# Patient Record
Sex: Female | Born: 1965
Health system: Southern US, Community
[De-identification: ages and names within clinical notes are randomized; demographics above are authoritative.]

## PROBLEM LIST (undated history)

## (undated) DIAGNOSIS — R5381 Other malaise: Secondary | ICD-10-CM

## (undated) DIAGNOSIS — M545 Low back pain, unspecified: Secondary | ICD-10-CM

## (undated) DIAGNOSIS — E079 Disorder of thyroid, unspecified: Secondary | ICD-10-CM

## (undated) DIAGNOSIS — F32A Depression, unspecified: Secondary | ICD-10-CM

## (undated) DIAGNOSIS — F419 Anxiety disorder, unspecified: Secondary | ICD-10-CM

## (undated) DIAGNOSIS — F329 Major depressive disorder, single episode, unspecified: Secondary | ICD-10-CM

## (undated) DIAGNOSIS — E039 Hypothyroidism, unspecified: Secondary | ICD-10-CM

## (undated) DIAGNOSIS — M5430 Sciatica, unspecified side: Secondary | ICD-10-CM

## (undated) DIAGNOSIS — R5383 Other fatigue: Secondary | ICD-10-CM

## (undated) HISTORY — DX: Low back pain, unspecified: M54.50

## (undated) HISTORY — DX: Other malaise: R53.81

## (undated) HISTORY — DX: Other malaise: R53.83

## (undated) HISTORY — PX: DILATION AND CURETTAGE OF UTERUS: SHX78

## (undated) HISTORY — PX: TUBAL LIGATION: SHX77

## (undated) HISTORY — DX: Disorder of thyroid, unspecified: E07.9

## (undated) HISTORY — DX: Low back pain: M54.5

## (undated) HISTORY — DX: Sciatica, unspecified side: M54.30

---

## 2010-08-13 ENCOUNTER — Ambulatory Visit: Payer: Self-pay

## 2010-09-10 ENCOUNTER — Ambulatory Visit: Payer: Self-pay | Admitting: Unknown Physician Specialty

## 2010-09-20 ENCOUNTER — Ambulatory Visit: Payer: Self-pay | Admitting: Unknown Physician Specialty

## 2010-10-29 ENCOUNTER — Ambulatory Visit: Payer: Self-pay

## 2013-08-30 LAB — HM PAP SMEAR

## 2014-08-11 LAB — HM MAMMOGRAPHY

## 2015-05-15 ENCOUNTER — Telehealth: Payer: Self-pay | Admitting: Unknown Physician Specialty

## 2015-05-15 MED ORDER — CITALOPRAM HYDROBROMIDE 20 MG PO TABS: 20.0000 mg | ORAL_TABLET | Freq: Every day | ORAL | Status: DC

## 2015-05-15 NOTE — Telephone Encounter (Signed)
E-Fax refill came through: Rx: Citalopram HBR 20 mg tab Pharmacy: [facility] [address]

## 2015-08-14 ENCOUNTER — Telehealth: Payer: Self-pay | Admitting: Unknown Physician Specialty

## 2015-08-14 DIAGNOSIS — N63 Unspecified lump in unspecified breast: Secondary | ICD-10-CM

## 2015-08-14 NOTE — Telephone Encounter (Signed)
Patient found lump. Mammogram ordered. Sent to [facility], [facility] to see if we can get it scheduled over the next couple of days.

## 2015-08-14 NOTE — Telephone Encounter (Signed)
Called to get patient scheduled for a diagnostic mammogram, her previous one was done in [place], [place] has to have those results before they can get her scheduled.Patient notified and states that she will go there today to sign those forms.

## 2015-08-17 NOTE — Telephone Encounter (Signed)
Spoke with [facility], they have not received the previous mammogram report yet. They will call and schedule her when they receive them . New order placed for [place].

## 2015-09-05 ENCOUNTER — Ambulatory Visit
Admission: RE | Admit: 2015-09-05 | Discharge: 2015-09-05 | Disposition: A | Discharge status: Home / Self Care | Payer: 59 | Source: Ambulatory Visit | Attending: Family Medicine | Admitting: Family Medicine

## 2015-09-05 ENCOUNTER — Other Ambulatory Visit: Payer: Self-pay | Admitting: Family Medicine

## 2015-09-05 ENCOUNTER — Encounter: Payer: Self-pay | Admitting: Family Medicine

## 2015-09-05 DIAGNOSIS — N63 Unspecified lump in unspecified breast: Secondary | ICD-10-CM

## 2016-01-10 DIAGNOSIS — R5381 Other malaise: Secondary | ICD-10-CM | POA: Insufficient documentation

## 2016-01-10 DIAGNOSIS — E039 Hypothyroidism, unspecified: Secondary | ICD-10-CM | POA: Insufficient documentation

## 2016-01-10 DIAGNOSIS — F322 Major depressive disorder, single episode, severe without psychotic features: Secondary | ICD-10-CM | POA: Insufficient documentation

## 2016-01-10 DIAGNOSIS — M545 Low back pain, unspecified: Secondary | ICD-10-CM | POA: Insufficient documentation

## 2016-01-10 DIAGNOSIS — R5383 Other fatigue: Secondary | ICD-10-CM

## 2016-01-10 DIAGNOSIS — M5430 Sciatica, unspecified side: Secondary | ICD-10-CM | POA: Insufficient documentation

## 2016-01-10 DIAGNOSIS — Z713 Dietary counseling and surveillance: Secondary | ICD-10-CM | POA: Insufficient documentation

## 2016-01-27 ENCOUNTER — Other Ambulatory Visit: Payer: Self-pay | Admitting: Unknown Physician Specialty

## 2016-01-28 NOTE — Telephone Encounter (Signed)
Needs check further refills 

## 2016-01-29 ENCOUNTER — Ambulatory Visit (INDEPENDENT_AMBULATORY_CARE_PROVIDER_SITE_OTHER): Payer: Managed Care, Other (non HMO) | Admitting: Unknown Physician Specialty

## 2016-01-29 ENCOUNTER — Encounter: Payer: Self-pay | Admitting: Unknown Physician Specialty

## 2016-01-29 VITALS — BP 117/80 | HR 103 | Temp 98.5°F | Ht 64.1 in | Wt 206.8 lb

## 2016-01-29 DIAGNOSIS — N6322 Unspecified lump in the left breast, upper inner quadrant: Secondary | ICD-10-CM | POA: Insufficient documentation

## 2016-01-29 DIAGNOSIS — Z0000 Encounter for general adult medical examination without abnormal findings: Secondary | ICD-10-CM | POA: Diagnosis not present

## 2016-01-29 DIAGNOSIS — F329 Major depressive disorder, single episode, unspecified: Secondary | ICD-10-CM

## 2016-01-29 DIAGNOSIS — N63 Unspecified lump in breast: Secondary | ICD-10-CM

## 2016-01-29 DIAGNOSIS — E038 Other specified hypothyroidism: Secondary | ICD-10-CM

## 2016-01-29 DIAGNOSIS — E785 Hyperlipidemia, unspecified: Secondary | ICD-10-CM | POA: Insufficient documentation

## 2016-01-29 DIAGNOSIS — F322 Major depressive disorder, single episode, severe without psychotic features: Secondary | ICD-10-CM

## 2016-01-29 MED ORDER — CITALOPRAM HYDROBROMIDE 20 MG PO TABS: 20.0000 mg | ORAL_TABLET | Freq: Every day | ORAL | Status: DC

## 2016-01-29 NOTE — Assessment & Plan Note (Addendum)
Stable, working on diet.  Will bring labs in

## 2016-01-29 NOTE — Progress Notes (Signed)
BP 117/80 mmHg  Pulse 103  Temp(Src) 98.5 F (36.9 C)  Ht 5' 4.1" (1.628 m)  Wt 206 lb 12.8 oz (93.804 kg)  BMI 35.39 kg/m2  SpO2 98%  LMP 12/15/2015 (Approximate)   Subjective:    Patient ID: Amy Gilbert, female    DOB: [birth year], 50 y.o.   MRN: [id number]  HPI: Amy Gilbert is a 50 y.o. female  Chief Complaint  Patient presents with  . Annual Exam  . Medication Refill    pt states she needs a refill on citalopram   Pt states she had labs at work but doesn't have them.  States her similar to last year which was high but not in the statin benefit group.  TSH was normal. States Hgb A1C is outside of normal.  She is working on her diet, going to [employer] and lost 10 pounds.    Depression:  Stable on current treatment.  Tried to go of fof Citalopram but "that didn't work."  Mammogram: Reviewed.  Negative but has a lipoma left breast.    Last pap 2017 and was normal  Past Surgical History  Procedure Laterality Date  . Tubal ligation    . Dilation and curettage of uterus     Past Medical History  Diagnosis Date  . Sciatica   . Malaise and fatigue   . Thyroid disease   . Lumbago    Family History  Problem Relation Age of Onset  . Cancer Mother     liver  . Lung disease Mother   . Cancer Father     lymphoma  . Hypertension Father   . Diabetes Maternal Grandmother   . Cancer Maternal Grandfather     lung  . Diabetes Paternal Grandmother     Relevant past medical, surgical, family and social history reviewed and updated as indicated. Interim medical history since our last visit reviewed. Allergies and medications reviewed and updated.  Review of Systems  Per HPI unless specifically indicated above     Objective:    BP 117/80 mmHg  Pulse 103  Temp(Src) 98.5 F (36.9 C)  Ht 5' 4.1" (1.628 m)  Wt 206 lb 12.8 oz (93.804 kg)  BMI 35.39 kg/m2  SpO2 98%  LMP 12/15/2015 (Approximate)  Wt Readings from Last 3 Encounters:  01/29/16  206 lb 12.8 oz (93.804 kg)  11/10/14 206 lb (93.441 kg)    Physical Exam  Constitutional: She is oriented to person, place, and time. She appears well-developed and well-nourished.  HENT:  Head: Normocephalic and atraumatic.  Eyes: Pupils are equal, round, and reactive to light. Right eye exhibits no discharge. Left eye exhibits no discharge. No scleral icterus.  Neck: Normal range of motion. Neck supple. Carotid bruit is not present. No thyromegaly present.  Cardiovascular: Normal rate, regular rhythm and normal heart sounds.  Exam reveals no gallop and no friction rub.   No murmur heard. Pulmonary/Chest: Effort normal and breath sounds normal. No respiratory distress. She has no wheezes. She has no rales. Right breast exhibits no inverted nipple, no mass, no nipple discharge, no skin change and no tenderness. Left breast exhibits mass. Left breast exhibits no inverted nipple, no nipple discharge, no skin change and no tenderness.    Abdominal: Soft. Bowel sounds are normal. There is no tenderness. There is no rebound.  Musculoskeletal: Normal range of motion.  Lymphadenopathy:    She has no cervical adenopathy.  Neurological: She is alert and oriented to person, place, and  time.  Skin: Skin is warm, dry and intact. No rash noted.  Psychiatric: She has a normal mood and affect. Her speech is normal and behavior is normal. Judgment and thought content normal. Cognition and memory are normal.       Assessment & Plan:   Problem List Items Addressed This Visit      Unprioritized   Severe depression    Stable, continue present medications.        Relevant Medications   citalopram (CELEXA) 20 MG tablet   Hypothyroidism    Pt states labs are stable.  She will bring them in      Breast lump on left side at 10 o'clock position - Primary    Mammogram and [place] suggest lipoma.  Refer to Dr Fleet Contras for further management.      Relevant Orders   Ambulatory referral to General Surgery     Hyperlipidemia    Stable, working on diet.  Will bring labs in        Other Visit Diagnoses    Annual physical exam            Follow up plan: Return in about 1 year (around 01/28/2017).

## 2016-01-29 NOTE — Assessment & Plan Note (Signed)
Pt states labs are stable.  She will bring them in

## 2016-01-29 NOTE — Assessment & Plan Note (Addendum)
Mammogram and [place] suggest lipoma.  Refer to Dr Fleet Contras for further management.

## 2016-01-29 NOTE — Assessment & Plan Note (Signed)
Stable, continue present medications.   

## 2016-01-30 ENCOUNTER — Encounter: Payer: Self-pay | Admitting: *Deleted

## 2016-02-12 ENCOUNTER — Ambulatory Visit: Payer: Self-pay | Admitting: General Surgery

## 2016-02-19 ENCOUNTER — Encounter: Payer: Self-pay | Admitting: General Surgery

## 2016-02-19 ENCOUNTER — Ambulatory Visit (INDEPENDENT_AMBULATORY_CARE_PROVIDER_SITE_OTHER): Payer: Managed Care, Other (non HMO) | Admitting: General Surgery

## 2016-02-19 VITALS — BP 134/70 | HR 88 | Resp 14 | Ht 63.7 in | Wt 204.0 lb

## 2016-02-19 DIAGNOSIS — D171 Benign lipomatous neoplasm of skin and subcutaneous tissue of trunk: Secondary | ICD-10-CM | POA: Insufficient documentation

## 2016-02-19 DIAGNOSIS — D1779 Benign lipomatous neoplasm of other sites: Secondary | ICD-10-CM

## 2016-02-19 NOTE — Progress Notes (Addendum)
Patient ID: Amy Gilbert, female   DOB: [birth year], 50 y.o.   MRN: [id number]  Chief Complaint  Patient presents with  . Other    left breast mass    HPI Amy Gilbert is a 50 y.o. female who presents for a breast evaluation. The most recent mammogram and left breast ultrasound was done on 09/2015. She states she noticed a lump in her left breast upper portion last year around September while looking in the mirror. Denies breast pain or injury. It has not changed in size. She feels like it is about the size of a "pecan". Patient does perform regular self breast checks and gets regular mammograms done.    She has lost her mother this past year to liver cancer.  I personally reviewed the patient's history.  HPI  Past Medical History  Diagnosis Date  . Sciatica   . Malaise and fatigue   . Thyroid disease   . Lumbago     Past Surgical History  Procedure Laterality Date  . Tubal ligation    . Dilation and curettage of uterus      Family History  Problem Relation Age of Onset  . Cancer Mother     liver age 46's  . Lung disease Mother   . Cancer Father     non hodgkins lymphoma  . Hypertension Father   . Diabetes Maternal Grandmother   . Cancer Maternal Grandfather     lung  . Diabetes Paternal Grandmother     Social History Social History  Substance Use Topics  . Smoking status: Never Smoker   . Smokeless tobacco: Never Used  . Alcohol Use: No    No Known Allergies  Current Outpatient Prescriptions  Medication Sig Dispense Refill  . Biotin w/ Vitamins C & E (HAIR SKIN & NAILS GUMMIES PO) Take by mouth. 2 gummies daily    . Cholecalciferol (VITAMIN D3 ADULT GUMMIES PO) Take by mouth. 2 gummies daily    . citalopram (CELEXA) 20 MG tablet Take 1 tablet (20 mg total) by mouth daily. 90 tablet 3  . levothyroxine (SYNTHROID, LEVOTHROID) 75 MCG tablet Take 1 tablet (75 mcg total) by mouth daily. 90 tablet 0  . MEGARED OMEGA-3 KRILL OIL 500 MG CAPS Take 500  mg by mouth daily.     No current facility-administered medications for this visit.    Review of Systems Review of Systems  Constitutional: Negative.   Respiratory: Negative.   Cardiovascular: Negative.     Blood pressure 134/70, pulse 88, resp. rate 14, height 5' 3.7" (1.618 m), weight 204 lb (92.534 kg), last menstrual period 01/30/2016.  Physical Exam Physical Exam  Constitutional: She is oriented to person, place, and time. She appears well-developed and well-nourished.  HENT:  Mouth/Throat: Oropharynx is clear and moist.  Eyes: Conjunctivae are normal. No scleral icterus.  Neck: Neck supple.  Cardiovascular: Normal rate, regular rhythm and normal heart sounds.   Pulmonary/Chest: Effort normal and breath sounds normal. Right breast exhibits no inverted nipple, no mass, no nipple discharge, no skin change and no tenderness. Left breast exhibits mass. Left breast exhibits no inverted nipple, no nipple discharge, no skin change and no tenderness.    3 x 4 cm soft mass left breast 17 CFN.  Abdominal: Soft. Bowel sounds are normal. There is no tenderness.  Lymphadenopathy:    She has no cervical adenopathy.    She has no axillary adenopathy.  Neurological: She is alert and oriented to person, place, and  time.  Skin: Skin is warm and dry.  Psychiatric: Her behavior is normal.    Data Reviewed 09/05/2015  lateral mammograms and ultrasound reviewed. Fatty replaced breast. No visible breast parenchyma at the area of the BB placed to identify the mass.  Ultrasound showed an isoechoic well-defined lesion consistent with a lipoma extending from the pectoralis fascia to just below the dermis. BI-RADS-2. These films were reviewed with the patient.  Assessment    Lipoma of the left breast/chest wall.    Plan    The area became evident after. Weight gain correlating with the death of her mother about 12 months ago. The area has not changed in size since initial discovery. She is  asymptomatic. Options for management were reviewed: Observation versus excision. His lungs the area remains asymptomatic, does not enlarge/produce anxiety, surgical excision is not required.     The patient is aware to call back for any new or change of symptoms, questions or concerns.   PCP: Kathrine Haddock  This information has been scribed by Karie Fetch RNBC.    Robert Bellow 02/19/2016, 2:29 PM   09/05/2015

## 2016-02-19 NOTE — Patient Instructions (Signed)
The patient is aware to call back for any new or change of symptoms, questions or concerns.

## 2016-02-25 ENCOUNTER — Encounter: Payer: Self-pay | Admitting: Unknown Physician Specialty

## 2016-02-25 ENCOUNTER — Ambulatory Visit (INDEPENDENT_AMBULATORY_CARE_PROVIDER_SITE_OTHER): Payer: Managed Care, Other (non HMO) | Admitting: Unknown Physician Specialty

## 2016-02-25 VITALS — BP 134/83 | HR 99 | Temp 99.0°F | Ht 63.8 in | Wt 202.6 lb

## 2016-02-25 DIAGNOSIS — J069 Acute upper respiratory infection, unspecified: Secondary | ICD-10-CM | POA: Diagnosis not present

## 2016-02-25 DIAGNOSIS — R05 Cough: Secondary | ICD-10-CM | POA: Diagnosis not present

## 2016-02-25 DIAGNOSIS — R059 Cough, unspecified: Secondary | ICD-10-CM

## 2016-02-25 LAB — VERITOR FLU A/B WAIVED
INFLUENZA A: NEGATIVE
Influenza B: NEGATIVE

## 2016-02-25 MED ORDER — GUAIFENESIN-CODEINE 100-10 MG/5ML PO SOLN: 10.0000 mL | Freq: Three times a day (TID) | ORAL | Status: DC | PRN

## 2016-02-25 NOTE — Patient Instructions (Addendum)
Codeine medication for cough.  Do not drive  Nasal congestion: Use Afrin for congestion only for night.  I'm a big fan of netti pots or saline nasal sprays.  You can use OTC pseudophed.    Upper Respiratory Infection, Adult Most upper respiratory infections (URIs) are a viral infection of the air passages leading to the lungs. A URI affects the nose, throat, and upper air passages. The most common type of URI is nasopharyngitis and is typically referred to as "the common cold." URIs run their course and usually go away on their own. Most of the time, a URI does not require medical attention, but sometimes a bacterial infection in the upper airways can follow a viral infection. This is called a secondary infection. Sinus and middle ear infections are common types of secondary upper respiratory infections. Bacterial pneumonia can also complicate a URI. A URI can worsen asthma and chronic obstructive pulmonary disease (COPD). Sometimes, these complications can require emergency medical care and may be life threatening.  CAUSES Almost all URIs are caused by viruses. A virus is a type of germ and can spread from one person to another.  RISKS FACTORS You may be at risk for a URI if:   You smoke.   You have chronic heart or lung disease.  You have a weakened defense (immune) system.   You are very young or very old.   You have nasal allergies or asthma.  You work in crowded or poorly ventilated areas.  You work in health care facilities or schools. SIGNS AND SYMPTOMS  Symptoms typically develop 2-3 days after you come in contact with a cold virus. Most viral URIs last 7-10 days. However, viral URIs from the influenza virus (flu virus) can last 14-18 days and are typically more severe. Symptoms may include:   Runny or stuffy (congested) nose.   Sneezing.   Cough.   Sore throat.   Headache.   Fatigue.   Fever.   Loss of appetite.   Pain in your forehead, behind your  eyes, and over your cheekbones (sinus pain).  Muscle aches.  DIAGNOSIS  Your health care provider may diagnose a URI by:  Physical exam.  Tests to check that your symptoms are not due to another condition such as:  Strep throat.  Sinusitis.  Pneumonia.  Asthma. TREATMENT  A URI goes away on its own with time. It cannot be cured with medicines, but medicines may be prescribed or recommended to relieve symptoms. Medicines may help:  Reduce your fever.  Reduce your cough.  Relieve nasal congestion. HOME CARE INSTRUCTIONS   Take medicines only as directed by your health care provider.   Gargle warm saltwater or take cough drops to comfort your throat as directed by your health care provider.  Use a warm mist humidifier or inhale steam from a shower to increase air moisture. This may make it easier to breathe.  Drink enough fluid to keep your urine clear or pale yellow.   Eat soups and other clear broths and maintain good nutrition.   Rest as needed.   Return to work when your temperature has returned to normal or as your health care provider advises. You may need to stay home longer to avoid infecting others. You can also use a face mask and careful hand washing to prevent spread of the virus.  Increase the usage of your inhaler if you have asthma.   Do not use any tobacco products, including cigarettes, chewing tobacco, or electronic  cigarettes. If you need help quitting, ask your health care provider. PREVENTION  The best way to protect yourself from getting a cold is to practice good hygiene.   Avoid oral or hand contact with people with cold symptoms.   Wash your hands often if contact occurs.  There is no clear evidence that vitamin C, vitamin E, echinacea, or exercise reduces the chance of developing a cold. However, it is always recommended to get plenty of rest, exercise, and practice good nutrition.  SEEK MEDICAL CARE IF:   You are getting worse  rather than better.   Your symptoms are not controlled by medicine.   You have chills.  You have worsening shortness of breath.  You have brown or red mucus.  You have yellow or brown nasal discharge.  You have pain in your face, especially when you bend forward.  You have a fever.  You have swollen neck glands.  You have pain while swallowing.  You have white areas in the back of your throat. SEEK IMMEDIATE MEDICAL CARE IF:   You have severe or persistent:  Headache.  Ear pain.  Sinus pain.  Chest pain.  You have chronic lung disease and any of the following:  Wheezing.  Prolonged cough.  Coughing up blood.  A change in your usual mucus.  You have a stiff neck.  You have changes in your:  Vision.  Hearing.  Thinking.  Mood. MAKE SURE YOU:   Understand these instructions.  Will watch your condition.  Will get help right away if you are not doing well or get worse.   This information is not intended to replace advice given to you by your health care provider. Make sure you discuss any questions you have with your health care provider.   Document Released: 05/13/2001 Document Revised: 04/03/2015 Document Reviewed: 02/22/2014 Elsevier Interactive Patient Education [employer].

## 2016-02-25 NOTE — Progress Notes (Signed)
   BP 134/83 mmHg  Pulse 99  Temp(Src) 99 F (37.2 C)  Ht 5' 3.8" (1.621 m)  Wt 202 lb 9.6 oz (91.899 kg)  BMI 34.97 kg/m2  SpO2 99%  LMP 01/30/2016 (Exact Date)   Subjective:    Patient ID: Amy Gilbert, female    DOB: [birth year], 50 y.o.   MRN: [id number]  HPI: Amy Gilbert is a 50 y.o. female  Chief Complaint  Patient presents with  . URI    pt states she has a cough, nasal congestion, headache, and sinus pressure. States symptoms started Friday.   . Cough   URI  This is a new problem. Episode onset: 3 days. The problem has been unchanged. There has been no fever. Associated symptoms include congestion and sinus pain. She has tried nothing for the symptoms. The treatment provided no relief.     Relevant past medical, surgical, family and social history reviewed and updated as indicated. Interim medical history since our last visit reviewed. Allergies and medications reviewed and updated.  Review of Systems  HENT: Positive for congestion.     Per HPI unless specifically indicated above     Objective:    BP 134/83 mmHg  Pulse 99  Temp(Src) 99 F (37.2 C)  Ht 5' 3.8" (1.621 m)  Wt 202 lb 9.6 oz (91.899 kg)  BMI 34.97 kg/m2  SpO2 99%  LMP 01/30/2016 (Exact Date)  Wt Readings from Last 3 Encounters:  02/25/16 202 lb 9.6 oz (91.899 kg)  02/19/16 204 lb (92.534 kg)  01/29/16 206 lb 12.8 oz (93.804 kg)    Physical Exam  Constitutional: She is oriented to person, place, and time. She appears well-developed and well-nourished. No distress.  HENT:  Head: Normocephalic and atraumatic.  Right Ear: Tympanic membrane and ear canal normal.  Left Ear: Tympanic membrane and ear canal normal.  Nose: Rhinorrhea present. Right sinus exhibits no maxillary sinus tenderness and no frontal sinus tenderness. Left sinus exhibits no maxillary sinus tenderness and no frontal sinus tenderness.  Mouth/Throat: Mucous membranes are normal. Posterior oropharyngeal erythema  present.  Eyes: Conjunctivae and lids are normal. Right eye exhibits no discharge. Left eye exhibits no discharge. No scleral icterus.  Cardiovascular: Normal rate and regular rhythm.   Pulmonary/Chest: Effort normal and breath sounds normal. No respiratory distress.  Abdominal: Normal appearance. There is no splenomegaly or hepatomegaly.  Musculoskeletal: Normal range of motion.  Neurological: She is alert and oriented to person, place, and time.  Skin: Skin is intact. No rash noted. No pallor.  Psychiatric: She has a normal mood and affect. Her behavior is normal. Judgment and thought content normal.    Results for orders placed or performed in visit on 01/10/16  HM MAMMOGRAPHY  Result Value Ref Range   HM Mammogram from PP   HM PAP SMEAR  Result Value Ref Range   HM Pap smear from PP       Assessment & Plan:   Problem List Items Addressed This Visit    None    Visit Diagnoses    Cough    -  Primary    Relevant Medications    guaiFENesin-codeine 100-10 MG/5ML syrup    Other Relevant Orders    Veritor Flu A/B Waived    Upper respiratory infection           Pt ed on supportive care   Follow up plan: Return if symptoms worsen or fail to improve.

## 2016-02-26 ENCOUNTER — Encounter: Payer: Self-pay | Admitting: Unknown Physician Specialty

## 2016-02-26 ENCOUNTER — Telehealth: Payer: Self-pay | Admitting: Unknown Physician Specialty

## 2016-02-26 MED ORDER — HYDROCOD POLST-CPM POLST ER 10-8 MG/5ML PO SUER: 5.0000 mL | Freq: Two times a day (BID) | ORAL | Status: DC | PRN

## 2016-02-26 NOTE — Telephone Encounter (Signed)
Patient called stating that she was seen yesterday at the office and states that the medication given to her did not work and needs something else called in. She would also like to talk to [place] and needs a doctor note, she has not worked because she is sick, thanks.

## 2016-02-26 NOTE — Telephone Encounter (Signed)
Routing to provider  

## 2016-02-26 NOTE — Telephone Encounter (Signed)
Rx for Tussinex.  Will pick up.  Discussed cost with pt.

## 2016-03-17 NOTE — Telephone Encounter (Signed)
Called and left voicemail for pt to return call and schedule follow up appointment. Thanks.

## 2016-04-26 ENCOUNTER — Other Ambulatory Visit: Payer: Self-pay | Admitting: Unknown Physician Specialty

## 2016-07-17 ENCOUNTER — Other Ambulatory Visit: Payer: Self-pay | Admitting: Unknown Physician Specialty

## 2016-07-18 ENCOUNTER — Other Ambulatory Visit: Payer: Self-pay | Admitting: Unknown Physician Specialty

## 2016-07-18 MED ORDER — LEVOTHYROXINE SODIUM 75 MCG PO TABS
75.0000 ug | ORAL_TABLET | Freq: Every day | ORAL | 0 refills | Status: DC
Start: 2016-07-18 — End: 2016-12-11

## 2016-07-18 NOTE — Telephone Encounter (Signed)
Pt called stated she needs a refill on Levothyroxine. Pharm is [facility]. Thanks.

## 2016-07-18 NOTE — Telephone Encounter (Signed)
Needs seen for further refills

## 2016-07-18 NOTE — Telephone Encounter (Signed)
Routing to provider  

## 2016-07-18 NOTE — Telephone Encounter (Signed)
Called and spoke to patient. She stated that she [facility] to a [facility] because she can get her labs for free. I told the patient we could write the order on a prescription pad and she can pick it up to take to get her labs drawn.

## 2016-10-16 ENCOUNTER — Other Ambulatory Visit: Payer: Self-pay | Admitting: Family Medicine

## 2016-10-16 NOTE — Telephone Encounter (Signed)
Your patient 

## 2016-12-10 ENCOUNTER — Other Ambulatory Visit: Payer: Self-pay | Admitting: Unknown Physician Specialty

## 2016-12-10 NOTE — Telephone Encounter (Signed)
Patient came by to give some information to be scanned in her chart. Gave to Donna to scan.  She also wanted me to let Amy Gilbert know she would need refills called before her next appt.  She states [facility] delivery should have faxed a request.  Thank You Santiago Glad

## 2016-12-11 MED ORDER — LEVOTHYROXINE SODIUM 75 MCG PO TABS: 75.0000 ug | ORAL_TABLET | Freq: Every day | ORAL | 0 refills | Status: DC

## 2016-12-11 MED ORDER — CITALOPRAM HYDROBROMIDE 20 MG PO TABS: 20.0000 mg | ORAL_TABLET | Freq: Every day | ORAL | 3 refills | Status: DC

## 2016-12-11 NOTE — Telephone Encounter (Signed)
Routing to provider  

## 2017-02-06 ENCOUNTER — Ambulatory Visit (INDEPENDENT_AMBULATORY_CARE_PROVIDER_SITE_OTHER): Payer: 59 | Admitting: Unknown Physician Specialty

## 2017-02-06 ENCOUNTER — Encounter: Payer: Self-pay | Admitting: Unknown Physician Specialty

## 2017-02-06 VITALS — BP 131/84 | HR 97 | Temp 98.0°F | Ht 63.9 in | Wt 217.0 lb

## 2017-02-06 DIAGNOSIS — E038 Other specified hypothyroidism: Secondary | ICD-10-CM | POA: Diagnosis not present

## 2017-02-06 DIAGNOSIS — E7800 Pure hypercholesterolemia, unspecified: Secondary | ICD-10-CM | POA: Diagnosis not present

## 2017-02-06 DIAGNOSIS — Z0000 Encounter for general adult medical examination without abnormal findings: Secondary | ICD-10-CM | POA: Diagnosis not present

## 2017-02-06 DIAGNOSIS — E669 Obesity, unspecified: Secondary | ICD-10-CM | POA: Insufficient documentation

## 2017-02-06 DIAGNOSIS — Z23 Encounter for immunization: Secondary | ICD-10-CM | POA: Diagnosis not present

## 2017-02-06 DIAGNOSIS — F322 Major depressive disorder, single episode, severe without psychotic features: Secondary | ICD-10-CM

## 2017-02-06 DIAGNOSIS — R69 Illness, unspecified: Secondary | ICD-10-CM | POA: Diagnosis not present

## 2017-02-06 MED ORDER — CITALOPRAM HYDROBROMIDE 20 MG PO TABS: 20.0000 mg | ORAL_TABLET | Freq: Every day | ORAL | 3 refills | Status: DC

## 2017-02-06 MED ORDER — LEVOTHYROXINE SODIUM 75 MCG PO TABS: 75.0000 ug | ORAL_TABLET | Freq: Every day | ORAL | 3 refills | Status: DC

## 2017-02-06 NOTE — Assessment & Plan Note (Signed)
Discussed diet and exercise 

## 2017-02-06 NOTE — Assessment & Plan Note (Signed)
Last TSH good.  Awaiting another in October

## 2017-02-06 NOTE — Assessment & Plan Note (Signed)
Shared decision making.  Refusing statin.

## 2017-02-06 NOTE — Assessment & Plan Note (Signed)
Stable, continue present medications.   

## 2017-02-06 NOTE — Patient Instructions (Addendum)
Tdap Vaccine (Tetanus, Diphtheria and Pertussis): What You Need to Know 1. Why get vaccinated? Tetanus, diphtheria and pertussis are very serious diseases. Tdap vaccine can protect us from these diseases. And, Tdap vaccine given to pregnant women can protect newborn babies against pertussis. TETANUS (Lockjaw) is rare in the [place] today. It causes painful muscle tightening and stiffness, usually all over the body.  It can lead to tightening of muscles in the head and neck so you can't open your mouth, swallow, or sometimes even breathe. Tetanus kills about 1 out of 10 people who are infected even after receiving the best medical care. DIPHTHERIA is also rare in the [place] today. It can cause a thick coating to form in the back of the throat.  It can lead to breathing problems, heart failure, paralysis, and death. PERTUSSIS (Whooping Cough) causes severe coughing spells, which can cause difficulty breathing, vomiting and disturbed sleep.  It can also lead to weight loss, incontinence, and rib fractures. Up to 2 in 100 adolescents and 5 in 100 adults with pertussis are hospitalized or have complications, which could include pneumonia or death. These diseases are caused by bacteria. Diphtheria and pertussis are spread from person to person through secretions from coughing or sneezing. Tetanus enters the body through cuts, scratches, or wounds. Before vaccines, as many as 200,000 cases of diphtheria, 200,000 cases of pertussis, and hundreds of cases of tetanus, were reported in the [place] each year. Since vaccination began, reports of cases for tetanus and diphtheria have dropped by about 99% and for pertussis by about 80%. 2. Tdap vaccine Tdap vaccine can protect adolescents and adults from tetanus, diphtheria, and pertussis. One dose of Tdap is routinely given at age 11 or 12. People who did not get Tdap at that age should get it as soon as possible. Tdap is especially important  for healthcare professionals and anyone having close contact with a baby younger than 12 months. Pregnant women should get a dose of Tdap during every pregnancy, to protect the newborn from pertussis. Infants are most at risk for severe, life-threatening complications from pertussis. Another vaccine, called Td, protects against tetanus and diphtheria, but not pertussis. A Td booster should be given every 10 years. Tdap may be given as one of these boosters if you have never gotten Tdap before. Tdap may also be given after a severe cut or burn to prevent tetanus infection. Your doctor or the person giving you the vaccine can give you more information. Tdap may safely be given at the same time as other vaccines. 3. Some people should not get this vaccine  A person who has ever had a life-threatening allergic reaction after a previous dose of any diphtheria, tetanus or pertussis containing vaccine, OR has a severe allergy to any part of this vaccine, should not get Tdap vaccine. Tell the person giving the vaccine about any severe allergies.  Anyone who had coma or long repeated seizures within 7 days after a childhood dose of DTP or DTaP, or a previous dose of Tdap, should not get Tdap, unless a cause other than the vaccine was found. They can still get Td.  Talk to your doctor if you:  have seizures or another nervous system problem,  had severe pain or swelling after any vaccine containing diphtheria, tetanus or pertussis,  ever had a condition called Guillain-Barr Syndrome (GBS),  aren't feeling well on the day the shot is scheduled. 4. Risks With any medicine, including vaccines, there is   a chance of side effects. These are usually mild and go away on their own. Serious reactions are also possible but are rare. Most people who get Tdap vaccine do not have any problems with it. Mild problems following Tdap:  (Did not interfere with activities)  Pain where the shot was given (about 3 in 4  adolescents or 2 in 3 adults)  Redness or swelling where the shot was given (about 1 person in 5)  Mild fever of at least 100.24F (up to about 1 in 25 adolescents or 1 in 100 adults)  Headache (about 3 or 4 people in 10)  Tiredness (about 1 person in 3 or 4)  Nausea, vomiting, diarrhea, stomach ache (up to 1 in 4 adolescents or 1 in 10 adults)  Chills, sore joints (about 1 person in 10)  Body aches (about 1 person in 3 or 4)  Rash, swollen glands (uncommon) Moderate problems following Tdap:  (Interfered with activities, but did not require medical attention)  Pain where the shot was given (up to 1 in 5 or 6)  Redness or swelling where the shot was given (up to about 1 in 16 adolescents or 1 in 12 adults)  Fever over 102F (about 1 in 100 adolescents or 1 in 250 adults)  Headache (about 1 in 7 adolescents or 1 in 10 adults)  Nausea, vomiting, diarrhea, stomach ache (up to 1 or 3 people in 100)  Swelling of the entire arm where the shot was given (up to about 1 in 500). Severe problems following Tdap:  (Unable to perform usual activities; required medical attention)  Swelling, severe pain, bleeding and redness in the arm where the shot was given (rare). Problems that could happen after any vaccine:   People sometimes faint after a medical procedure, including vaccination. Sitting or lying down for about 15 minutes can help prevent fainting, and injuries caused by a fall. Tell your doctor if you feel dizzy, or have vision changes or ringing in the ears.  Some people get severe pain in the shoulder and have difficulty moving the arm where a shot was given. This happens very rarely.  Any medication can cause a severe allergic reaction. Such reactions from a vaccine are very rare, estimated at fewer than 1 in a million doses, and would happen within a few minutes to a few hours after the vaccination. As with any medicine, there is a very remote chance of a vaccine causing a  serious injury or death. The safety of vaccines is always being monitored. For more information, visit: http://www.aguilar.org/ 5. What if there is a serious problem? What should I look for?  Look for anything that concerns you, such as signs of a severe allergic reaction, very high fever, or unusual behavior. Signs of a severe allergic reaction can include hives, swelling of the face and throat, difficulty breathing, a fast heartbeat, dizziness, and weakness. These would usually start a few minutes to a few hours after the vaccination. What should I do?   If you think it is a severe allergic reaction or other emergency that can't wait, call 9-1-1 or get the person to the nearest hospital. Otherwise, call your doctor.  Afterward, the reaction should be reported to the Vaccine Adverse Event Reporting System (VAERS). Your doctor might file this report, or you can do it yourself through the VAERS web site at www.vaers.SamedayNews.es, or by calling 2392699893.  VAERS does not give medical advice. 6. The National Vaccine [employer]  Vaccine Injury Compensation Program (VICP) is a federal program that was created to compensate people who may have been injured by certain vaccines. Persons who believe they may have been injured by a vaccine can learn about the program and about filing a claim by calling 757-421-3238 or visiting the [place] website at [email or web address]. There is a time limit to file a claim for compensation. 7. How can I learn more?  Ask your doctor. He or she can give you the vaccine package insert or suggest other sources of information.  Call your local or state health department.  Contact the Centers for Disease Control and Prevention (CDC):  Call 740 764 8392 (1-800-CDC-INFO) or  Visit CDC's website at http://hunter.com/ CDC Tdap Vaccine VIS (01/24/14) This information is not intended to replace advice given to you by your health  care provider. Make sure you discuss any questions you have with your health care provider. Document Released: 05/18/2012 Document Revised: 08/07/2016 Document Reviewed: 08/07/2016 Elsevier Interactive Patient Education  2017 [employer]. Tdap Vaccine (Tetanus, Diphtheria and Pertussis): What You Need to Know 1. Why get vaccinated? Tetanus, diphtheria and pertussis are very serious diseases. Tdap vaccine can protect [place] from these diseases. And, Tdap vaccine given to pregnant women can protect newborn babies against pertussis. TETANUS (Lockjaw) is rare in the [place] today. It causes painful muscle tightening and stiffness, usually all over the body.  It can lead to tightening of muscles in the head and neck so you can't open your mouth, swallow, or sometimes even breathe. Tetanus kills about 1 out of 10 people who are infected even after receiving the best medical care. DIPHTHERIA is also rare in the [place] today. It can cause a thick coating to form in the back of the throat.  It can lead to breathing problems, heart failure, paralysis, and death. PERTUSSIS (Whooping Cough) causes severe coughing spells, which can cause difficulty breathing, vomiting and disturbed sleep.  It can also lead to weight loss, incontinence, and rib fractures. Up to 2 in 100 adolescents and 5 in 100 adults with pertussis are hospitalized or have complications, which could include pneumonia or death. These diseases are caused by bacteria. Diphtheria and pertussis are spread from person to person through secretions from coughing or sneezing. Tetanus enters the body through cuts, scratches, or wounds. Before vaccines, as many as 200,000 cases of diphtheria, 200,000 cases of pertussis, and hundreds of cases of tetanus, were reported in the [place] each year. Since vaccination began, reports of cases for tetanus and diphtheria have dropped by about 99% and for pertussis by about 80%. 2. Tdap vaccine Tdap  vaccine can protect adolescents and adults from tetanus, diphtheria, and pertussis. One dose of Tdap is routinely given at age 23 or 23. People who did not get Tdap at that age should get it as soon as possible. Tdap is especially important for healthcare professionals and anyone having close contact with a baby younger than 12 months. Pregnant women should get a dose of Tdap during every pregnancy, to protect the newborn from pertussis. Infants are most at risk for severe, life-threatening complications from pertussis. Another vaccine, called Td, protects against tetanus and diphtheria, but not pertussis. A Td booster should be given every 10 years. Tdap may be given as one of these boosters if you have never gotten Tdap before. Tdap may also be given after a severe cut or burn to prevent tetanus infection. Your doctor or the person giving you the vaccine can give you more  information. Tdap may safely be given at the same time as other vaccines. 3. Some people should not get this vaccine  A person who has ever had a life-threatening allergic reaction after a previous dose of any diphtheria, tetanus or pertussis containing vaccine, OR has a severe allergy to any part of this vaccine, should not get Tdap vaccine. Tell the person giving the vaccine about any severe allergies.  Anyone who had coma or long repeated seizures within 7 days after a childhood dose of DTP or DTaP, or a previous dose of Tdap, should not get Tdap, unless a cause other than the vaccine was found. They can still get Td.  Talk to your doctor if you:  have seizures or another nervous system problem,  had severe pain or swelling after any vaccine containing diphtheria, tetanus or pertussis,  ever had a condition called Guillain-Barr Syndrome (GBS),  aren't feeling well on the day the shot is scheduled. 4. Risks With any medicine, including vaccines, there is a chance of side effects. These are usually mild and go away on  their own. Serious reactions are also possible but are rare. Most people who get Tdap vaccine do not have any problems with it. Mild problems following Tdap:  (Did not interfere with activities)  Pain where the shot was given (about 3 in 4 adolescents or 2 in 3 adults)  Redness or swelling where the shot was given (about 1 person in 5)  Mild fever of at least 100.48F (up to about 1 in 25 adolescents or 1 in 100 adults)  Headache (about 3 or 4 people in 10)  Tiredness (about 1 person in 3 or 4)  Nausea, vomiting, diarrhea, stomach ache (up to 1 in 4 adolescents or 1 in 10 adults)  Chills, sore joints (about 1 person in 10)  Body aches (about 1 person in 3 or 4)  Rash, swollen glands (uncommon) Moderate problems following Tdap:  (Interfered with activities, but did not require medical attention)  Pain where the shot was given (up to 1 in 5 or 6)  Redness or swelling where the shot was given (up to about 1 in 16 adolescents or 1 in 12 adults)  Fever over 102F (about 1 in 100 adolescents or 1 in 250 adults)  Headache (about 1 in 7 adolescents or 1 in 10 adults)  Nausea, vomiting, diarrhea, stomach ache (up to 1 or 3 people in 100)  Swelling of the entire arm where the shot was given (up to about 1 in 500). Severe problems following Tdap:  (Unable to perform usual activities; required medical attention)  Swelling, severe pain, bleeding and redness in the arm where the shot was given (rare). Problems that could happen after any vaccine:   People sometimes faint after a medical procedure, including vaccination. Sitting or lying down for about 15 minutes can help prevent fainting, and injuries caused by a fall. Tell your doctor if you feel dizzy, or have vision changes or ringing in the ears.  Some people get severe pain in the shoulder and have difficulty moving the arm where a shot was given. This happens very rarely.  Any medication can cause a severe allergic reaction. Such  reactions from a vaccine are very rare, estimated at fewer than 1 in a million doses, and would happen within a few minutes to a few hours after the vaccination. As with any medicine, there is a very remote chance of a vaccine causing a serious injury or death. The  safety of vaccines is always being monitored. For more information, visit: http://www.aguilar.org/ 5. What if there is a serious problem? What should I look for?  Look for anything that concerns you, such as signs of a severe allergic reaction, very high fever, or unusual behavior. Signs of a severe allergic reaction can include hives, swelling of the face and throat, difficulty breathing, a fast heartbeat, dizziness, and weakness. These would usually start a few minutes to a few hours after the vaccination. What should I do?   If you think it is a severe allergic reaction or other emergency that can't wait, call 9-1-1 or get the person to the nearest hospital. Otherwise, call your doctor.  Afterward, the reaction should be reported to the Vaccine Adverse Event Reporting System (VAERS). Your doctor might file this report, or you can do it yourself through the VAERS web site at www.vaers.SamedayNews.es, or by calling [phone].  VAERS does not give medical advice. 6. The National Vaccine Injury Compensation Program The [employer] Vaccine Injury Compensation Program (VICP) is a federal program that was created to compensate people who may have been injured by certain vaccines. Persons who believe they may have been injured by a vaccine can learn about the program and about filing a claim by calling [phone] or visiting the [place] website at [email or web address].ee. There is a time limit to file a claim for compensation. 7. How can I learn more?  Ask your doctor. He or she can give you the vaccine package insert or suggest other sources of information.  Call your local or state health department.  Contact the Centers for  Disease Control and Prevention (CDC):  Call 769-384-5262 (1-800-CDC-INFO) or  Visit CDC's website at http://hunter.com/ CDC Tdap Vaccine VIS (01/24/14) This information is not intended to replace advice given to you by your health care provider. Make sure you discuss any questions you have with your health care provider. Document Released: 05/18/2012 Document Revised: 08/07/2016 Document Reviewed: 08/07/2016 Elsevier Interactive Patient Education  2017 [employer]. Tdap Vaccine (Tetanus, Diphtheria and Pertussis): What You Need to Know 1. Why get vaccinated? Tetanus, diphtheria and pertussis are very serious diseases. Tdap vaccine can protect [place] from these diseases. And, Tdap vaccine given to pregnant women can protect newborn babies against pertussis. TETANUS (Lockjaw) is rare in the [place] today. It causes painful muscle tightening and stiffness, usually all over the body.  It can lead to tightening of muscles in the head and neck so you can't open your mouth, swallow, or sometimes even breathe. Tetanus kills about 1 out of 10 people who are infected even after receiving the best medical care. DIPHTHERIA is also rare in the [place] today. It can cause a thick coating to form in the back of the throat.  It can lead to breathing problems, heart failure, paralysis, and death. PERTUSSIS (Whooping Cough) causes severe coughing spells, which can cause difficulty breathing, vomiting and disturbed sleep.  It can also lead to weight loss, incontinence, and rib fractures. Up to 2 in 100 adolescents and 5 in 100 adults with pertussis are hospitalized or have complications, which could include pneumonia or death. These diseases are caused by bacteria. Diphtheria and pertussis are spread from person to person through secretions from coughing or sneezing. Tetanus enters the body through cuts, scratches, or wounds. Before vaccines, as many as 200,000 cases of diphtheria, 200,000 cases of  pertussis, and hundreds of cases of tetanus, were reported in the [place] each year. Since vaccination began, reports  of cases for tetanus and diphtheria have dropped by about 99% and for pertussis by about 80%. 2. Tdap vaccine Tdap vaccine can protect adolescents and adults from tetanus, diphtheria, and pertussis. One dose of Tdap is routinely given at age 47 or 11. People who did not get Tdap at that age should get it as soon as possible. Tdap is especially important for healthcare professionals and anyone having close contact with a baby younger than 12 months. Pregnant women should get a dose of Tdap during every pregnancy, to protect the newborn from pertussis. Infants are most at risk for severe, life-threatening complications from pertussis. Another vaccine, called Td, protects against tetanus and diphtheria, but not pertussis. A Td booster should be given every 10 years. Tdap may be given as one of these boosters if you have never gotten Tdap before. Tdap may also be given after a severe cut or burn to prevent tetanus infection. Your doctor or the person giving you the vaccine can give you more information. Tdap may safely be given at the same time as other vaccines. 3. Some people should not get this vaccine  A person who has ever had a life-threatening allergic reaction after a previous dose of any diphtheria, tetanus or pertussis containing vaccine, OR has a severe allergy to any part of this vaccine, should not get Tdap vaccine. Tell the person giving the vaccine about any severe allergies.  Anyone who had coma or long repeated seizures within 7 days after a childhood dose of DTP or DTaP, or a previous dose of Tdap, should not get Tdap, unless a cause other than the vaccine was found. They can still get Td.  Talk to your doctor if you:  have seizures or another nervous system problem,  had severe pain or swelling after any vaccine containing diphtheria, tetanus or  pertussis,  ever had a condition called Guillain-Barr Syndrome (GBS),  aren't feeling well on the day the shot is scheduled. 4. Risks With any medicine, including vaccines, there is a chance of side effects. These are usually mild and go away on their own. Serious reactions are also possible but are rare. Most people who get Tdap vaccine do not have any problems with it. Mild problems following Tdap:  (Did not interfere with activities)  Pain where the shot was given (about 3 in 4 adolescents or 2 in 3 adults)  Redness or swelling where the shot was given (about 1 person in 5)  Mild fever of at least 100.85F (up to about 1 in 25 adolescents or 1 in 100 adults)  Headache (about 3 or 4 people in 10)  Tiredness (about 1 person in 3 or 4)  Nausea, vomiting, diarrhea, stomach ache (up to 1 in 4 adolescents or 1 in 10 adults)  Chills, sore joints (about 1 person in 10)  Body aches (about 1 person in 3 or 4)  Rash, swollen glands (uncommon) Moderate problems following Tdap:  (Interfered with activities, but did not require medical attention)  Pain where the shot was given (up to 1 in 5 or 6)  Redness or swelling where the shot was given (up to about 1 in 16 adolescents or 1 in 12 adults)  Fever over 102F (about 1 in 100 adolescents or 1 in 250 adults)  Headache (about 1 in 7 adolescents or 1 in 10 adults)  Nausea, vomiting, diarrhea, stomach ache (up to 1 or 3 people in 100)  Swelling of the entire arm where the shot was given (  up to about 1 in 500). Severe problems following Tdap:  (Unable to perform usual activities; required medical attention)  Swelling, severe pain, bleeding and redness in the arm where the shot was given (rare). Problems that could happen after any vaccine:   People sometimes faint after a medical procedure, including vaccination. Sitting or lying down for about 15 minutes can help prevent fainting, and injuries caused by a fall. Tell your doctor if  you feel dizzy, or have vision changes or ringing in the ears.  Some people get severe pain in the shoulder and have difficulty moving the arm where a shot was given. This happens very rarely.  Any medication can cause a severe allergic reaction. Such reactions from a vaccine are very rare, estimated at fewer than 1 in a million doses, and would happen within a few minutes to a few hours after the vaccination. As with any medicine, there is a very remote chance of a vaccine causing a serious injury or death. The safety of vaccines is always being monitored. For more information, visit: http://www.aguilar.org/ 5. What if there is a serious problem? What should I look for?  Look for anything that concerns you, such as signs of a severe allergic reaction, very high fever, or unusual behavior. Signs of a severe allergic reaction can include hives, swelling of the face and throat, difficulty breathing, a fast heartbeat, dizziness, and weakness. These would usually start a few minutes to a few hours after the vaccination. What should I do?   If you think it is a severe allergic reaction or other emergency that can't wait, call 9-1-1 or get the person to the nearest hospital. Otherwise, call your doctor.  Afterward, the reaction should be reported to the Vaccine Adverse Event Reporting System (VAERS). Your doctor might file this report, or you can do it yourself through the VAERS web site at www.vaers.SamedayNews.es, or by calling [phone].  VAERS does not give medical advice. 6. The National Vaccine Injury Compensation Program The [employer] Vaccine Injury Compensation Program (VICP) is a federal program that was created to compensate people who may have been injured by certain vaccines. Persons who believe they may have been injured by a vaccine can learn about the program and about filing a claim by calling [phone] or visiting the [place] website at GoldCloset.com.ee. There is a  time limit to file a claim for compensation. 7. How can I learn more?  Ask your doctor. He or she can give you the vaccine package insert or suggest other sources of information.  Call your local or state health department.  Contact the Centers for Disease Control and Prevention (CDC):  Call 703-048-1512 (1-800-CDC-INFO) or  Visit CDC's website at http://hunter.com/ CDC Tdap Vaccine VIS (01/24/14) This information is not intended to replace advice given to you by your health care provider. Make sure you discuss any questions you have with your health care provider. Document Released: 05/18/2012 Document Revised: 08/07/2016 Document Reviewed: 08/07/2016 Elsevier Interactive Patient Education  2017 [employer].

## 2017-02-06 NOTE — Progress Notes (Signed)
BP 131/84 (BP Location: Left Arm, Patient Position: Sitting, Cuff Size: Large)   Pulse 97   Temp 98 F (36.7 C)   Ht 5' 3.9" (1.623 m)   Wt 217 lb (98.4 kg)   LMP 01/16/2017 (Exact Date)   SpO2 99%   BMI 37.36 kg/m    Subjective:    Patient ID: Amy Gilbert, female    DOB: [birth year], 51 y.o.   MRN: [id number]  HPI: Amy Gilbert is a 51 y.o. female  Chief Complaint  Patient presents with  . Annual Exam   Will get labs done in October.  Last labs reviewed and all normal except elevated cholesterol with LDL 177.  No family history of heart disease.  ASCVD calulator puts 10 year risk at 1.8%.    Depression States she is "fine" on the Citalapram Depression screen [facility] 2/9 02/06/2017 01/29/2016  Decreased Interest 0 0  Down, Depressed, Hopeless 0 0  PHQ - 2 Score 0 0   Hypothyroid Not complaints of fatigue.  TSH 2.19  Allergies Using nose spray and Zyrtec.    Relevant past medical, surgical, family and social history reviewed and updated as indicated. Interim medical history since our last visit reviewed. Allergies and medications reviewed and updated.  Review of Systems  Per HPI unless specifically indicated above     Objective:    BP 131/84 (BP Location: Left Arm, Patient Position: Sitting, Cuff Size: Large)   Pulse 97   Temp 98 F (36.7 C)   Ht 5' 3.9" (1.623 m)   Wt 217 lb (98.4 kg)   LMP 01/16/2017 (Exact Date)   SpO2 99%   BMI 37.36 kg/m   Wt Readings from Last 3 Encounters:  02/06/17 217 lb (98.4 kg)  02/25/16 202 lb 9.6 oz (91.9 kg)  02/19/16 204 lb (92.5 kg)    Physical Exam  Constitutional: She is oriented to person, place, and time. She appears well-developed and well-nourished.  HENT:  Head: Normocephalic and atraumatic.  Eyes: Pupils are equal, round, and reactive to light. Right eye exhibits no discharge. Left eye exhibits no discharge. No scleral icterus.  Neck: Normal range of motion. Neck supple. Carotid bruit is not present.  No thyromegaly present.  Cardiovascular: Normal rate, regular rhythm and normal heart sounds.  Exam reveals no gallop and no friction rub.   No murmur heard. Pulmonary/Chest: Effort normal and breath sounds normal. No respiratory distress. She has no wheezes. She has no rales.  Abdominal: Soft. Bowel sounds are normal. There is no tenderness. There is no rebound.  Genitourinary: Vagina normal and uterus normal. No breast swelling, tenderness or discharge. Cervix exhibits no motion tenderness, no discharge and no friability. Right adnexum displays no mass, no tenderness and no fullness. Left adnexum displays no mass, no tenderness and no fullness.  Musculoskeletal: Normal range of motion.  Lymphadenopathy:    She has no cervical adenopathy.  Neurological: She is alert and oriented to person, place, and time.  Skin: Skin is warm, dry and intact. No rash noted.  Psychiatric: She has a normal mood and affect. Her speech is normal and behavior is normal. Judgment and thought content normal. Cognition and memory are normal.    Results for orders placed or performed in visit on 02/25/16  Veritor Flu A/B Waived  Result Value Ref Range   Influenza A Negative Negative   Influenza B Negative Negative      Assessment & Plan:   Problem List Items Addressed This Visit  Unprioritized   Hyperlipidemia    Shared decision making.  Refusing statin.        Hypothyroidism    Last TSH good.  Awaiting another in October      Relevant Medications   levothyroxine (SYNTHROID, LEVOTHROID) 75 MCG tablet   Obesity (BMI 35.0-39.9 without comorbidity)    Discussed diet and exercise      Severe depression (HCC)    Stable, continue present medications.        Relevant Medications   citalopram (CELEXA) 20 MG tablet    Other Visit Diagnoses    Need for diphtheria-tetanus-pertussis (Tdap) vaccine, adult/adolescent    -  Primary   Relevant Orders   Tdap vaccine greater than or equal to 7yo IM  (Completed)   Annual physical exam       Relevant Orders   IGP, Aptima HPV, rfx 16/18,45       Follow up plan: Return in about 1 year (around 02/06/2018).

## 2017-02-11 LAB — IGP, APTIMA HPV, RFX 16/18,45
HPV APTIMA: NEGATIVE
PAP SMEAR COMMENT: 0

## 2017-02-18 ENCOUNTER — Encounter: Payer: Self-pay | Admitting: Family Medicine

## 2017-02-18 ENCOUNTER — Ambulatory Visit (INDEPENDENT_AMBULATORY_CARE_PROVIDER_SITE_OTHER): Payer: 59 | Admitting: Family Medicine

## 2017-02-18 VITALS — BP 129/85 | HR 100 | Temp 98.5°F | Wt 216.0 lb

## 2017-02-18 DIAGNOSIS — N390 Urinary tract infection, site not specified: Secondary | ICD-10-CM | POA: Diagnosis not present

## 2017-02-18 DIAGNOSIS — R399 Unspecified symptoms and signs involving the genitourinary system: Secondary | ICD-10-CM | POA: Diagnosis not present

## 2017-02-18 MED ORDER — SULFAMETHOXAZOLE-TRIMETHOPRIM 800-160 MG PO TABS: 1.0000 | ORAL_TABLET | Freq: Two times a day (BID) | ORAL | 0 refills | Status: DC

## 2017-02-18 NOTE — Patient Instructions (Signed)
Follow up as needed

## 2017-02-18 NOTE — Progress Notes (Signed)
   BP 129/85   Pulse 100   Temp 98.5 F (36.9 C)   Wt 216 lb (98 kg)   LMP 02/13/2017 (Exact Date)   SpO2 99%   BMI 37.19 kg/m    Subjective:    Patient ID: Amy Gilbert, female    DOB: 06-30-1966, 51 y.o.   MRN: [id number]  HPI: Amy Gilbert is a 51 y.o. female  Chief Complaint  Patient presents with  . Urinary Tract Infection    started this am. painful urination, hematuria, frequency, urgency, dribble of urine at a time.   Patient presents with 1 day of dysuria, hematuria, frequency, urgency. Denies fever, chills, N/V, back pain. Hx of UTIs, but it has been almost 20 years since having one. Not taking anything OTC for sxs.   Relevant past medical, surgical, family and social history reviewed and updated as indicated. Interim medical history since our last visit reviewed. Allergies and medications reviewed and updated.  Review of Systems  Constitutional: Negative.   Respiratory: Negative.   Cardiovascular: Negative.   Gastrointestinal: Negative.   Genitourinary: Positive for dysuria, frequency, hematuria and urgency.  Musculoskeletal: Negative.   Neurological: Negative.   Psychiatric/Behavioral: Negative.     Per HPI unless specifically indicated above     Objective:    BP 129/85   Pulse 100   Temp 98.5 F (36.9 C)   Wt 216 lb (98 kg)   LMP 02/13/2017 (Exact Date)   SpO2 99%   BMI 37.19 kg/m   Wt Readings from Last 3 Encounters:  02/18/17 216 lb (98 kg)  02/06/17 217 lb (98.4 kg)  02/25/16 202 lb 9.6 oz (91.9 kg)    Physical Exam  Constitutional: She is oriented to person, place, and time. She appears well-developed and well-nourished.  HENT:  Head: Atraumatic.  Eyes: Conjunctivae are normal. Pupils are equal, round, and reactive to light.  Neck: Normal range of motion. Neck supple.  Cardiovascular: Normal rate and normal heart sounds.   Pulmonary/Chest: Effort normal and breath sounds normal. No respiratory distress.  Abdominal: Soft.  Bowel sounds are normal. There is no tenderness.  Musculoskeletal: Normal range of motion.  No CVA tenderness  Lymphadenopathy:    She has no cervical adenopathy.  Neurological: She is alert and oriented to person, place, and time.  Skin: Skin is warm and dry.  Psychiatric: She has a normal mood and affect. Her behavior is normal.  Nursing note and vitals reviewed.     Assessment & Plan:   Problem List Items Addressed This Visit    None    Visit Diagnoses    Acute lower UTI    -  Primary   U/A + for UTI. Will treat with bactrim. Discussed probiotic, good hydration, full elimination with each urination, etc. Await cx, f/u if no improvement    Relevant Medications   sulfamethoxazole-trimethoprim (BACTRIM DS,SEPTRA DS) 800-160 MG tablet   Other Relevant Orders   UA/M w/rflx Culture, Routine (STAT) (Completed)       Follow up plan: Return if symptoms worsen or fail to improve.

## 2017-02-26 ENCOUNTER — Telehealth: Payer: Self-pay

## 2017-02-26 LAB — MICROSCOPIC EXAMINATION

## 2017-02-26 LAB — UA/M W/RFLX CULTURE, ROUTINE
Bilirubin, UA: NEGATIVE
Glucose, UA: NEGATIVE
Ketones, UA: NEGATIVE
NITRITE UA: NEGATIVE
PH UA: 6 (ref 5.0–7.5)
Specific Gravity, UA: 1.025 (ref 1.005–1.030)
Urobilinogen, Ur: 1 mg/dL (ref 0.2–1.0)

## 2017-02-26 LAB — URINE CULTURE, REFLEX

## 2017-02-26 NOTE — Telephone Encounter (Signed)
She had an urine with reflux, but the culture was never set up and it was never pulled for testing. Once it was discovered, it was too late to do the culture. They just wanted to let us know that it was a lab error.

## 2017-02-27 NOTE — Telephone Encounter (Signed)
Spoke with lab about calling pt regarding this specimen handling error. If still symptomatic, she should return for a repeat U/A

## 2017-03-04 ENCOUNTER — Other Ambulatory Visit: Payer: Self-pay | Admitting: Family Medicine

## 2017-09-21 DIAGNOSIS — Z23 Encounter for immunization: Secondary | ICD-10-CM | POA: Diagnosis not present

## 2017-10-02 ENCOUNTER — Other Ambulatory Visit: Payer: Self-pay | Admitting: Unknown Physician Specialty

## 2017-10-02 DIAGNOSIS — Z1231 Encounter for screening mammogram for malignant neoplasm of breast: Secondary | ICD-10-CM

## 2017-10-21 ENCOUNTER — Ambulatory Visit
Admission: RE | Admit: 2017-10-21 | Discharge: 2017-10-21 | Disposition: A | Discharge status: Home / Self Care | Payer: 59 | Source: Ambulatory Visit | Attending: Unknown Physician Specialty | Admitting: Unknown Physician Specialty

## 2017-10-21 DIAGNOSIS — Z23 Encounter for immunization: Secondary | ICD-10-CM | POA: Diagnosis not present

## 2017-10-21 DIAGNOSIS — L814 Other melanin hyperpigmentation: Secondary | ICD-10-CM | POA: Diagnosis not present

## 2017-10-21 DIAGNOSIS — D485 Neoplasm of uncertain behavior of skin: Secondary | ICD-10-CM | POA: Diagnosis not present

## 2017-10-21 DIAGNOSIS — L821 Other seborrheic keratosis: Secondary | ICD-10-CM | POA: Diagnosis not present

## 2017-10-21 DIAGNOSIS — D225 Melanocytic nevi of trunk: Secondary | ICD-10-CM | POA: Diagnosis not present

## 2017-10-21 DIAGNOSIS — D1801 Hemangioma of skin and subcutaneous tissue: Secondary | ICD-10-CM | POA: Diagnosis not present

## 2017-10-21 DIAGNOSIS — Z1231 Encounter for screening mammogram for malignant neoplasm of breast: Secondary | ICD-10-CM | POA: Diagnosis not present

## 2017-11-29 ENCOUNTER — Other Ambulatory Visit: Payer: Self-pay | Admitting: Unknown Physician Specialty

## 2017-11-30 NOTE — Telephone Encounter (Addendum)
Left message for patient to call to schedule appointment with [facility].  If patient cannot schedule appointment she needs at least a lab appointment in order to get her medication refilled.    Thanks

## 2017-11-30 NOTE — Telephone Encounter (Signed)
Please call patient and ask her to come in for labs for medication refill. She has not been seen since March.  Thank you.

## 2017-11-30 NOTE — Telephone Encounter (Signed)
Pt needs labs.

## 2017-12-03 NOTE — Telephone Encounter (Signed)
Patient has not returned call to schedule. Letter generated and sent to patient to please call and schedule f/up appointment for medication refills.

## 2017-12-03 NOTE — Telephone Encounter (Signed)
I think she gets labs outside the office.  OK to refill until physical due in February

## 2018-01-15 ENCOUNTER — Other Ambulatory Visit: Payer: Self-pay

## 2018-01-15 MED ORDER — CITALOPRAM HYDROBROMIDE 20 MG PO TABS: 20.0000 mg | ORAL_TABLET | Freq: Every day | ORAL | 3 refills | Status: DC

## 2018-01-15 NOTE — Telephone Encounter (Signed)
Copied from [place] [id number]. Topic: Inquiry >> Jan 15, 2018  8:49 AM Pricilla Handler wrote: Reason for CRM: Patient called requesting a refill of Citalopram (CELEXA) 20 MG tablet to be sent to her [facility].       Thank You!!!   Routing to provider. Patient last seen 02/06/17.

## 2018-02-19 ENCOUNTER — Encounter: Payer: 59 | Admitting: Unknown Physician Specialty

## 2018-04-28 ENCOUNTER — Other Ambulatory Visit: Payer: Self-pay

## 2018-04-28 MED ORDER — LEVOTHYROXINE SODIUM 75 MCG PO TABS: 75.0000 ug | ORAL_TABLET | Freq: Every day | ORAL | 0 refills | Status: DC

## 2018-04-28 NOTE — Telephone Encounter (Signed)
Needs tsh further refills

## 2018-04-28 NOTE — Telephone Encounter (Signed)
Letter generated and sent to patient.  

## 2018-07-16 ENCOUNTER — Other Ambulatory Visit: Payer: Self-pay | Admitting: Unknown Physician Specialty

## 2018-07-20 ENCOUNTER — Encounter: Payer: Self-pay | Admitting: Physician Assistant

## 2018-07-20 ENCOUNTER — Other Ambulatory Visit: Payer: Self-pay

## 2018-07-20 ENCOUNTER — Ambulatory Visit (INDEPENDENT_AMBULATORY_CARE_PROVIDER_SITE_OTHER): Payer: 59 | Admitting: Physician Assistant

## 2018-07-20 VITALS — BP 140/89 | HR 79 | Temp 98.1°F | Ht 64.0 in | Wt 216.6 lb

## 2018-07-20 DIAGNOSIS — Z0000 Encounter for general adult medical examination without abnormal findings: Secondary | ICD-10-CM | POA: Diagnosis not present

## 2018-07-20 DIAGNOSIS — Z130 Encounter for screening for diseases of the blood and blood-forming organs and certain disorders involving the immune mechanism: Secondary | ICD-10-CM

## 2018-07-20 DIAGNOSIS — Z23 Encounter for immunization: Secondary | ICD-10-CM | POA: Diagnosis not present

## 2018-07-20 DIAGNOSIS — Z1211 Encounter for screening for malignant neoplasm of colon: Secondary | ICD-10-CM

## 2018-07-20 DIAGNOSIS — Z131 Encounter for screening for diabetes mellitus: Secondary | ICD-10-CM

## 2018-07-20 DIAGNOSIS — E7800 Pure hypercholesterolemia, unspecified: Secondary | ICD-10-CM | POA: Diagnosis not present

## 2018-07-20 DIAGNOSIS — E030 Congenital hypothyroidism with diffuse goiter: Secondary | ICD-10-CM

## 2018-07-20 DIAGNOSIS — Z5320 Procedure and treatment not carried out because of patient's decision for unspecified reasons: Secondary | ICD-10-CM

## 2018-07-20 DIAGNOSIS — F322 Major depressive disorder, single episode, severe without psychotic features: Secondary | ICD-10-CM

## 2018-07-20 DIAGNOSIS — R69 Illness, unspecified: Secondary | ICD-10-CM | POA: Diagnosis not present

## 2018-07-20 NOTE — Progress Notes (Signed)
Subjective:    Patient ID: Amy Gilbert, female    DOB: [birth year], 52 y.o.   MRN: [id number]  Amy Gilbert is a 52 y.o. female presenting on 07/20/2018 for Medication Refill (synthroid, celexa)   HPI   Here for annual physical exam and follow up.   Mammogram: 10/21/2017 normal, no family history of breast cancer Colon Cancer Screening: Never done, due this year. PAP: 02/06/2017 normal and negative HPV Tetanus: 02/06/2017  Hypothyroidism: Taking 75 mcg synthroid. [facility], reports she got them last year and TSH was ~2 though they are not visible in this sytem.  Celexa 20 mg for depression and anxiety. Doing well on this, has tried to come off before unsuccessfully.   BP Readings from Last 3 Encounters:  07/20/18 140/89  02/18/17 129/85  02/06/17 131/84    Social History   Tobacco Use  . Smoking status: Never Smoker  . Smokeless tobacco: Never Used  Substance Use Topics  . Alcohol use: Yes    Alcohol/week: 0.0 standard drinks    Comment: rarely  . Drug use: No    Review of Systems Per HPI unless specifically indicated above     Objective:    BP 140/89   Pulse 79   Temp 98.1 F (36.7 C) (Oral)   Ht _0  (1.626 m)   Wt 216 lb 9.6 oz (98.2 kg)   SpO2 98%   BMI 37.18 kg/m   Wt Readings from Last 3 Encounters:  07/20/18 216 lb 9.6 oz (98.2 kg)  02/18/17 216 lb (98 kg)  02/06/17 217 lb (98.4 kg)    Physical Exam  Constitutional: She is oriented to person, place, and time. She appears well-developed and well-nourished.  HENT:  Right Ear: External ear normal.  Left Ear: External ear normal.  Mouth/Throat: Oropharynx is clear and moist.  Neck: Neck supple.  Cardiovascular: Normal rate and regular rhythm.  Pulmonary/Chest: Effort normal and breath sounds normal.  Abdominal: Soft. Bowel sounds are normal.  Lymphadenopathy:    She has no cervical adenopathy.  Neurological: She is alert and oriented to person, place, and time.  Skin:  Skin is warm and dry.  Psychiatric: She has a normal mood and affect. Her behavior is normal.   Depression screen [facility] 2/9 07/20/2018 02/06/2017 01/29/2016  Decreased Interest 0 0 0  Down, Depressed, Hopeless 0 0 0  PHQ - 2 Score 0 0 0  Altered sleeping 2 - -  Tired, decreased energy 2 - -  Change in appetite 2 - -  Feeling bad or failure about yourself  0 - -  Trouble concentrating 0 - -  Moving slowly or fidgety/restless 0 - -  Suicidal thoughts 0 - -  PHQ-9 Score 6 - -    Results for orders placed or performed in visit on 02/18/17  Microscopic Examination  Result Value Ref Range   WBC, UA 11-30 (A) 0 - 5 /hpf   RBC, UA >30 (A) 0 - 2 /hpf   Epithelial Cells (non renal) 0-10 0 - 10 /hpf   Bacteria, UA Few None seen/Few  UA/M w/rflx Culture, Routine (STAT)  Result Value Ref Range   Specific Gravity, UA 1.025 1.005 - 1.030   pH, UA 6.0 5.0 - 7.5   Color, UA Orange Yellow   Appearance Ur Turbid (A) Clear   Leukocytes, UA 1+ (A) Negative   Protein, UA 2+ (A) Negative/Trace   Glucose, UA Negative Negative   Ketones, UA Negative  Negative   RBC, UA 3+ (A) Negative   Bilirubin, UA Negative Negative   Urobilinogen, Ur 1.0 0.2 - 1.0 mg/dL   Nitrite, UA Negative Negative   Microscopic Examination See below:    Urinalysis Reflex Comment   Urine Culture, Routine  Result Value Ref Range   Urine Culture, Routine CANCELED (A)       Assessment & Plan:  1. Annual physical exam  Mammo due next year. Colonoscopy referral placed. All else up to date, labs as below. She needs her labs from [place]. BP borderline high today, patient reports weight gain. Will have her come back in 6 months after weight loss effort.  2. Pure hypercholesterolemia  - Lipid Profile  3. HIV screening declined  - HIV antibody (with reflex)  4. Screening for deficiency anemia  - CBC with Differential  5. Diabetes mellitus screening  - Comp Met (CMET)  6. Congenital hypothyroidism with diffuse goiter  -  TSH  7. Screening for colon cancer   8. Flu vaccine need  - Flu Vaccine QUAD 36+ mos IM  9. Colon cancer screening  - Ambulatory referral to Gastroenterology  10. Severe depression (HCC)  Refilled celexa 20 mg daily.    Follow up plan: Return in about 6 months (around 01/20/2019) for blood pressure check .  Carles Collet, PA-C [facility] 07/20/2018, 1:13 PM

## 2018-07-21 ENCOUNTER — Telehealth: Payer: Self-pay | Admitting: Gastroenterology

## 2018-07-21 LAB — LIPID PANEL
CHOLESTEROL: 285 — AB (ref 0–200)
HDL: 63 (ref 35–70)
LDL Cholesterol: 184
TRIGLYCERIDES: 202 — AB (ref 40–160)

## 2018-07-21 LAB — HEPATIC FUNCTION PANEL
ALT: 10 (ref 7–35)
AST: 21 (ref 13–35)
Alkaline Phosphatase: 89 (ref 25–125)
Bilirubin, Total: 0.4

## 2018-07-21 LAB — BASIC METABOLIC PANEL
BUN: 13 (ref 4–21)
Creatinine: 0.7 (ref 0.5–1.1)
GLUCOSE: 92
POTASSIUM: 3.9 (ref 3.4–5.3)
SODIUM: 140 (ref 137–147)

## 2018-07-21 LAB — CBC AND DIFFERENTIAL
HEMATOCRIT: 37 (ref 36–46)
HEMOGLOBIN: 11.7 — AB (ref 12.0–16.0)
PLATELETS: 563 — AB (ref 150–399)
WBC: 8.6

## 2018-07-21 LAB — TSH: TSH: 1.44 (ref 0.41–5.90)

## 2018-07-21 NOTE — Telephone Encounter (Signed)
Patient was returning a call to schedule a procedure. Please call

## 2018-07-22 ENCOUNTER — Other Ambulatory Visit: Payer: Self-pay

## 2018-07-22 DIAGNOSIS — Z1211 Encounter for screening for malignant neoplasm of colon: Secondary | ICD-10-CM

## 2018-08-03 ENCOUNTER — Encounter: Payer: Self-pay | Admitting: *Deleted

## 2018-08-04 ENCOUNTER — Ambulatory Visit: Payer: 59 | Admitting: Registered Nurse

## 2018-08-04 ENCOUNTER — Encounter
Admission: RE | Disposition: A | Discharge status: Home / Self Care | Payer: Self-pay | Source: Ambulatory Visit | Attending: Gastroenterology

## 2018-08-04 ENCOUNTER — Ambulatory Visit
Admission: RE | Admit: 2018-08-04 | Discharge: 2018-08-04 | Disposition: A | Discharge status: Home / Self Care | Payer: 59 | Source: Ambulatory Visit | Attending: Gastroenterology | Admitting: Gastroenterology

## 2018-08-04 ENCOUNTER — Encounter: Payer: Self-pay | Admitting: *Deleted

## 2018-08-04 DIAGNOSIS — E079 Disorder of thyroid, unspecified: Secondary | ICD-10-CM | POA: Insufficient documentation

## 2018-08-04 DIAGNOSIS — Z6837 Body mass index (BMI) 37.0-37.9, adult: Secondary | ICD-10-CM | POA: Diagnosis not present

## 2018-08-04 DIAGNOSIS — K5730 Diverticulosis of large intestine without perforation or abscess without bleeding: Secondary | ICD-10-CM | POA: Diagnosis not present

## 2018-08-04 DIAGNOSIS — Z8249 Family history of ischemic heart disease and other diseases of the circulatory system: Secondary | ICD-10-CM | POA: Insufficient documentation

## 2018-08-04 DIAGNOSIS — Z79890 Hormone replacement therapy (postmenopausal): Secondary | ICD-10-CM | POA: Insufficient documentation

## 2018-08-04 DIAGNOSIS — Z1211 Encounter for screening for malignant neoplasm of colon: Secondary | ICD-10-CM | POA: Diagnosis not present

## 2018-08-04 DIAGNOSIS — Z7951 Long term (current) use of inhaled steroids: Secondary | ICD-10-CM | POA: Insufficient documentation

## 2018-08-04 DIAGNOSIS — Z807 Family history of other malignant neoplasms of lymphoid, hematopoietic and related tissues: Secondary | ICD-10-CM | POA: Insufficient documentation

## 2018-08-04 DIAGNOSIS — Z800 Family history of malignant neoplasm of digestive organs: Secondary | ICD-10-CM | POA: Insufficient documentation

## 2018-08-04 DIAGNOSIS — Z79899 Other long term (current) drug therapy: Secondary | ICD-10-CM | POA: Insufficient documentation

## 2018-08-04 DIAGNOSIS — K635 Polyp of colon: Secondary | ICD-10-CM | POA: Diagnosis not present

## 2018-08-04 DIAGNOSIS — F329 Major depressive disorder, single episode, unspecified: Secondary | ICD-10-CM | POA: Insufficient documentation

## 2018-08-04 DIAGNOSIS — D122 Benign neoplasm of ascending colon: Secondary | ICD-10-CM | POA: Insufficient documentation

## 2018-08-04 HISTORY — DX: Depression, unspecified: F32.A

## 2018-08-04 HISTORY — PX: COLONOSCOPY WITH PROPOFOL: SHX5780

## 2018-08-04 HISTORY — DX: Major depressive disorder, single episode, unspecified: F32.9

## 2018-08-04 HISTORY — DX: Anxiety disorder, unspecified: F41.9

## 2018-08-04 SURGERY — COLONOSCOPY WITH PROPOFOL
Anesthesia: General

## 2018-08-04 MED ORDER — PROPOFOL 500 MG/50ML IV EMUL
INTRAVENOUS | Status: DC | PRN
  Administered 2018-08-04: 150 ug/kg/min via INTRAVENOUS

## 2018-08-04 MED ORDER — SODIUM CHLORIDE 0.9 % IV SOLN
INTRAVENOUS | Status: DC
  Administered 2018-08-04: 1000 mL via INTRAVENOUS

## 2018-08-04 MED ORDER — PROPOFOL 500 MG/50ML IV EMUL
INTRAVENOUS | Status: AC
  Filled 2018-08-04: qty 50

## 2018-08-04 MED ORDER — PROPOFOL 10 MG/ML IV BOLUS
INTRAVENOUS | Status: DC | PRN
  Administered 2018-08-04: 10 mg via INTRAVENOUS
  Administered 2018-08-04: 70 mg via INTRAVENOUS

## 2018-08-04 MED ORDER — MIDAZOLAM HCL 2 MG/2ML IJ SOLN
INTRAMUSCULAR | Status: AC
  Filled 2018-08-04: qty 2

## 2018-08-04 MED ORDER — LIDOCAINE HCL (CARDIAC) PF 100 MG/5ML IV SOSY
PREFILLED_SYRINGE | INTRAVENOUS | Status: DC | PRN
  Administered 2018-08-04: 40 mg via INTRAVENOUS

## 2018-08-04 MED ORDER — PHENYLEPHRINE HCL 10 MG/ML IJ SOLN
INTRAMUSCULAR | Status: DC | PRN
  Administered 2018-08-04 (×2): 100 ug via INTRAVENOUS

## 2018-08-04 MED ORDER — MIDAZOLAM HCL 2 MG/2ML IJ SOLN
INTRAMUSCULAR | Status: DC | PRN
  Administered 2018-08-04: 2 mg via INTRAVENOUS

## 2018-08-04 NOTE — H&P (Signed)
Amy Antigua, MD [address] [place], [place], [place], [address] [address], [place], [place], [place], [address] Phone: [phone]  Fax: [phone]  Primary Care Physician:  Kathrine Haddock, NP   Pre-Procedure History & Physical: HPI:  Amy Gilbert is a 52 y.o. female is here for a colonoscopy.   Past Medical History:  Diagnosis Date  . Anxiety   . Depression   . Lumbago   . Malaise and fatigue   . Sciatica   . Thyroid disease     Past Surgical History:  Procedure Laterality Date  . DILATION AND CURETTAGE OF UTERUS    . TUBAL LIGATION      Prior to Admission medications   Medication Sig Start Date End Date Taking? Authorizing Provider  cetirizine (ZYRTEC) 10 MG tablet Take 10 mg by mouth daily.    [provider]  citalopram (CELEXA) 20 MG tablet Take 1 tablet (20 mg total) by mouth daily. 01/15/18   Kathrine Haddock, NP  levothyroxine (SYNTHROID, LEVOTHROID) 75 MCG tablet Take 1 tablet (75 mcg total) by mouth daily. 02/06/17   Kathrine Haddock, NP  triamcinolone (NASACORT ALLERGY 24HR) 55 MCG/ACT AERO nasal inhaler Place 2 sprays into the nose daily.    [provider]  TURMERIC PO Take by mouth daily.    [provider]    Allergies as of 07/23/2018  . (No Known Allergies)    Family History  Problem Relation Age of Onset  . Cancer Mother        liver age 31's  . Lung disease Mother   . Cancer Father        non hodgkins lymphoma  . Hypertension Father   . Diabetes Maternal Grandmother   . Cancer Maternal Grandfather        lung  . Diabetes Paternal Grandmother     Social History   Socioeconomic History  . Marital status: Single    Spouse name: Not on file  . Number of children: Not on file  . Years of education: Not on file  . Highest education level: Not on file  Occupational History  . Not on file  Social Needs  . Financial resource strain: Not on file  . Food insecurity:    Worry: Not on file   Inability: Not on file  . Transportation needs:    Medical: Not on file    Non-medical: Not on file  Tobacco Use  . Smoking status: Never Smoker  . Smokeless tobacco: Never Used  Substance and Sexual Activity  . Alcohol use: Yes    Alcohol/week: 0.0 standard drinks    Comment: rarely  . Drug use: No  . Sexual activity: Yes  Lifestyle  . Physical activity:    Days per week: Not on file    Minutes per session: Not on file  . Stress: Not on file  Relationships  . Social connections:    Talks on phone: Not on file    Gets together: Not on file    Attends religious service: Not on file    Active member of club or organization: Not on file    Attends meetings of clubs or organizations: Not on file    Relationship status: Not on file  . Intimate partner violence:    Fear of current or ex partner: Not on file    Emotionally abused: Not on file    Physically abused: Not on file    Forced sexual activity: Not on file  Other Topics Concern  . Not on file  Social History Narrative  . Not on file    Review of Systems: See HPI, otherwise negative ROS  Physical Exam: BP (!) 130/99   Pulse 84   Temp (!) 97.2 F (36.2 C) (Tympanic)   Resp 16   Ht 5\' 4"  (1.626 m)   Wt 98 kg   SpO2 100%   BMI 37.08 kg/m  General:   Alert,  pleasant and cooperative in NAD Head:  Normocephalic and atraumatic. Neck:  Supple; no masses or thyromegaly. Lungs:  Clear throughout to auscultation, normal respiratory effort.    Heart:  +S1, +S2, Regular rate and rhythm, No edema. Abdomen:  Soft, nontender and nondistended. Normal bowel sounds, without guarding, and without rebound.   Neurologic:  Alert and  oriented x4;  grossly normal neurologically.  Impression/Plan: Amy Gilbert is here for a colonoscopy to be performed for average risk screening.  Risks, benefits, limitations, and alternatives regarding  colonoscopy have been reviewed with the patient.  Questions have been answered.  All  parties agreeable.   Virgel Manifold, MD  08/04/2018, 8:04 AM

## 2018-08-04 NOTE — Transfer of Care (Signed)
Immediate Anesthesia Transfer of Care Note  Patient: Amy Gilbert  Procedure(s) Performed: Procedure(s): COLONOSCOPY WITH PROPOFOL (N/A)  Patient Location: PACU and Endoscopy Unit  Anesthesia Type:General  Level of Consciousness: sedated  Airway & Oxygen Therapy: Patient Spontanous Breathing and Patient connected to nasal cannula oxygen  Post-op Assessment: Report given to RN and Post -op Vital signs reviewed and stable  Post vital signs: Reviewed and stable  Last Vitals:  Vitals:   08/04/18 0748 08/04/18 0845  BP: (!) 130/99 108/67  Pulse: 84 95  Resp: 16 (!) 24  Temp: (!) 36.2 C 36.5 C  SpO2: 757% 97%    Complications: No apparent anesthesia complications

## 2018-08-04 NOTE — Op Note (Signed)
[facility] Gastroenterology Patient Name: Amy Gilbert Procedure Date: 08/04/2018 8:09 AM MRN: [id number] Account #: [id number] Date of Birth: 12/13/1965 Admit Type: Outpatient Age: 52 Room: [facility] ENDO ROOM 4 Gender: Female Note Status: Finalized Procedure:            Colonoscopy Indications:          Screening for colorectal malignant neoplasm Providers:            Jessi Pitstick B. Bonna Gains MD, MD Referring MD:         Kathrine Haddock (Referring MD) Medicines:            Monitored Anesthesia Care Complications:        No immediate complications. Procedure:            Pre-Anesthesia Assessment:                       - ASA Grade Assessment: II - A patient with mild                        systemic disease.                       - Prior to the procedure, a History and Physical was                        performed, and patient medications, allergies and                        sensitivities were reviewed. The patient's tolerance of                        previous anesthesia was reviewed.                       - The risks and benefits of the procedure and the                        sedation options and risks were discussed with the                        patient. All questions were answered and informed                        consent was obtained.                       - Patient identification and proposed procedure were                        verified prior to the procedure by the physician, the                        nurse, the anesthesiologist, the anesthetist and the                        technician. The procedure was verified in the procedure                        room.                       After  obtaining informed consent, the colonoscope was                        passed under direct vision. Throughout the procedure,                        the patient's blood pressure, pulse, and oxygen                        saturations were monitored continuously. The                  Colonoscope was introduced through the anus and                        advanced to the the cecum, identified by appendiceal                        orifice and ileocecal valve. The colonoscopy was                        performed with ease. The patient tolerated the                        procedure well. The quality of the bowel preparation                        was good. Findings:      The perianal and digital rectal examinations were normal.      A 3 mm polyp was found in the ascending colon. The polyp was sessile.       The polyp was removed with a cold biopsy forceps. Resection and       retrieval were complete.      A 5 mm polyp was found in the ascending colon. The polyp was sessile.       The polyp was removed with a cold snare. Resection and retrieval were       complete.      A single diverticulum was found in the sigmoid colon.      The exam was otherwise without abnormality.      The rectum, sigmoid colon, descending colon, transverse colon, ascending       colon and cecum appeared normal.      The retroflexed view of the distal rectum and anal verge was normal and       showed no anal or rectal abnormalities. Impression:           - One 3 mm polyp in the ascending colon, removed with a                        cold biopsy forceps. Resected and retrieved.                       - One 5 mm polyp in the ascending colon, removed with a                        cold snare. Resected and retrieved.                       - Diverticulosis in the sigmoid colon.                       -  The examination was otherwise normal.                       - The rectum, sigmoid colon, descending colon,                        transverse colon, ascending colon and cecum are normal.                       - The distal rectum and anal verge are normal on                        retroflexion view. Recommendation:       - Discharge patient to home (with escort).                       - Advance  diet as tolerated.                       - Continue present medications.                       - Await pathology results.                       - Repeat colonoscopy in 5 years for surveillance.                       - The findings and recommendations were discussed with                        the patient.                       - The findings and recommendations were discussed with                        the patient's family.                       - Return to primary care physician as previously                        scheduled.                       - High fiber diet. Procedure Code(s):    --- Professional ---                       [phone], Colonoscopy, flexible; with removal of tumor(s),                        polyp(s), or other lesion(s) by snare technique                       45380, 64, Colonoscopy, flexible; with biopsy, single                        or multiple Diagnosis Code(s):    --- Professional ---                       Z12.11, Encounter for screening for malignant neoplasm  of colon                       D12.2, Benign neoplasm of ascending colon                       K57.30, Diverticulosis of large intestine without                        perforation or abscess without bleeding CPT copyright 2017 American Medical Association. All rights reserved. The codes documented in this report are preliminary and upon coder review may  be revised to meet current compliance requirements.  [name], MD Margretta Sidle B. Bonna Gains MD, MD 08/04/2018 8:46:23 AM This report has been signed electronically. Number of Addenda: 0 Note Initiated On: 08/04/2018 8:09 AM Scope Withdrawal Time: 0 hours 17 minutes 54 seconds  Total Procedure Duration: 0 hours 24 minutes 2 seconds  Estimated Blood Loss: Estimated blood loss: none.      [facility]

## 2018-08-04 NOTE — Anesthesia Procedure Notes (Signed)
Date/Time: 08/04/2018 8:12 AM Performed by: Doreen Salvage, CRNA Pre-anesthesia Checklist: Patient identified, Emergency Drugs available, Suction available and Patient being monitored Patient Re-evaluated:Patient Re-evaluated prior to induction Oxygen Delivery Method: Nasal cannula Induction Type: IV induction Dental Injury: Teeth and Oropharynx as per pre-operative assessment  Comments: Nasal cannula with etCO2 monitoring

## 2018-08-04 NOTE — Anesthesia Preprocedure Evaluation (Addendum)
Anesthesia Evaluation  Patient identified by MRN, date of birth, ID band Patient awake    Reviewed: Allergy & Precautions, H&P , NPO status , Patient's Chart, lab work & pertinent test results  Airway Mallampati: II  TM Distance: >3 FB Neck ROM: full    Dental   Pulmonary neg pulmonary ROS,           Cardiovascular negative cardio ROS       Neuro/Psych PSYCHIATRIC DISORDERS Anxiety Depression  Neuromuscular disease (sciatica) negative neurological ROS  negative psych ROS   GI/Hepatic negative GI ROS, Neg liver ROS,   Endo/Other  negative endocrine ROSHypothyroidism Morbid obesity  Renal/GU negative Renal ROS  negative genitourinary   Musculoskeletal   Abdominal   Peds  Hematology negative hematology ROS (+)   Anesthesia Other Findings Past Medical History: No date: Anxiety No date: Depression No date: Lumbago No date: Malaise and fatigue No date: Sciatica No date: Thyroid disease  Past Surgical History: No date: DILATION AND CURETTAGE OF UTERUS No date: TUBAL LIGATION  BMI    Body Mass Index:  37.08 kg/m      Reproductive/Obstetrics negative OB ROS                            Anesthesia Physical Anesthesia Plan  ASA: II  Anesthesia Plan: General   Post-op Pain Management:    Induction:   PONV Risk Score and Plan: Propofol infusion and TIVA  Airway Management Planned:   Additional Equipment:   Intra-op Plan:   Post-operative Plan:   Informed Consent: I have reviewed the patients History and Physical, chart, labs and discussed the procedure including the risks, benefits and alternatives for the proposed anesthesia with the patient or authorized representative who has indicated his/her understanding and acceptance.   Dental Advisory Given  Plan Discussed with: Anesthesiologist, CRNA and Surgeon  Anesthesia Plan Comments:        Anesthesia Quick Evaluation

## 2018-08-04 NOTE — Anesthesia Post-op Follow-up Note (Signed)
Anesthesia QCDR form completed.        

## 2018-08-05 ENCOUNTER — Encounter: Payer: Self-pay | Admitting: Gastroenterology

## 2018-08-05 LAB — SURGICAL PATHOLOGY

## 2018-08-05 NOTE — Anesthesia Postprocedure Evaluation (Signed)
Anesthesia Post Note  Patient: Amy Gilbert  Procedure(s) Performed: COLONOSCOPY WITH PROPOFOL (N/A )  Patient location during evaluation: PACU Anesthesia Type: General Level of consciousness: awake and alert Pain management: pain level controlled Vital Signs Assessment: post-procedure vital signs reviewed and stable Respiratory status: spontaneous breathing, nonlabored ventilation and respiratory function stable Cardiovascular status: blood pressure returned to baseline and stable Postop Assessment: no apparent nausea or vomiting Anesthetic complications: no     Last Vitals:  Vitals:   08/04/18 0905 08/04/18 0915  BP: 118/80 111/67  Pulse: 75 78  Resp: 15 17  Temp:    SpO2: 100% 100%    Last Pain:  Vitals:   08/04/18 0845  TempSrc: Tympanic                 Durenda Hurt

## 2018-08-09 ENCOUNTER — Encounter: Payer: Self-pay | Admitting: Gastroenterology

## 2018-08-23 NOTE — Telephone Encounter (Signed)
Pt called and states that it is going to be easier if orders for lab work can be faxed.  Please fax to [phone] - Attn: [employer] on [address] in [place]. Pt can be reached at [phone]

## 2018-08-23 NOTE — Telephone Encounter (Signed)
Pt called back. °

## 2018-10-03 ENCOUNTER — Other Ambulatory Visit: Payer: Self-pay | Admitting: Physician Assistant

## 2018-10-04 NOTE — Telephone Encounter (Signed)
Refill request approved.  Patient has appointment in February.

## 2018-10-11 LAB — CBC AND DIFFERENTIAL
HCT: 38 (ref 36–46)
HEMOGLOBIN: 12 (ref 12.0–16.0)
Hemoglobin: 25.8 — AB (ref 12.0–16.0)
NEUTROS ABS: 5474
PLATELETS: 405 — AB (ref 150–399)
Platelets: 405 — AB (ref 150–399)
WBC: 8.5

## 2018-10-11 LAB — IRON,TIBC AND FERRITIN PANEL
%SAT: 13
%SAT: 13
Iron: 63
TIBC: 481
TIBC: 481

## 2018-10-20 ENCOUNTER — Encounter: Payer: Self-pay | Admitting: Family Medicine

## 2018-10-20 DIAGNOSIS — H5203 Hypermetropia, bilateral: Secondary | ICD-10-CM | POA: Diagnosis not present

## 2018-12-30 ENCOUNTER — Other Ambulatory Visit: Payer: Self-pay | Admitting: Unknown Physician Specialty

## 2018-12-30 ENCOUNTER — Other Ambulatory Visit: Payer: Self-pay | Admitting: *Deleted

## 2018-12-30 ENCOUNTER — Other Ambulatory Visit: Payer: Self-pay | Admitting: Nurse Practitioner

## 2018-12-30 NOTE — Telephone Encounter (Signed)
30 day supply given. Last TSH 08/19. Next OV February.

## 2019-01-21 ENCOUNTER — Ambulatory Visit: Payer: 59 | Admitting: Nurse Practitioner

## 2019-01-29 ENCOUNTER — Other Ambulatory Visit: Payer: Self-pay | Admitting: Nurse Practitioner

## 2019-01-31 NOTE — Telephone Encounter (Signed)
Requested medication (s) are due for refill today: yes  Requested medication (s) are on the active medication list: yes  Last refill:  12/30/2018  Future visit scheduled: no  Notes to clinic:  Hypothyroid agents failed. Normal TSH within 360 days.  Requested Prescriptions  Pending Prescriptions Disp Refills   SYNTHROID 75 MCG tablet [Pharmacy Med Name: SYNTHROID TAB 0.075MG ] 30 tablet 0    Sig: TAKE 1 TABLET DAILY     Endocrinology:  Hypothyroid Agents Failed - 01/29/2019  5:40 AM      Failed - TSH needs to be rechecked within 3 months after an abnormal result. Refill until TSH is due.      Passed - TSH in normal range and within 360 days    TSH  Date Value Ref Range Status  07/21/2018 1.44 0.41 - 5.90 Final         Passed - Valid encounter within last 12 months    Recent Outpatient Visits          6 months ago Annual physical exam   [facility] Trinna Post, PA-C   1 year ago Acute lower UTI   [facility], Lilia Argue, [place]   1 year ago Need for diphtheria-tetanus-pertussis (Tdap) vaccine, adult/adolescent   [facility] Kathrine Haddock, NP   2 years ago Cough   [place], NP   3 years ago Breast lump on left side at 10 o'clock position   [facility] Kathrine Haddock, NP

## 2019-01-31 NOTE — Telephone Encounter (Signed)
For next refill will need appointment and TSH.

## 2019-03-13 ENCOUNTER — Other Ambulatory Visit: Payer: Self-pay | Admitting: Nurse Practitioner

## 2019-03-14 ENCOUNTER — Other Ambulatory Visit: Payer: Self-pay | Admitting: Unknown Physician Specialty

## 2019-03-14 MED ORDER — LEVOTHYROXINE SODIUM 75 MCG PO TABS: 75.0000 ug | ORAL_TABLET | Freq: Every day | ORAL | 1 refills | Status: DC

## 2019-03-14 NOTE — Telephone Encounter (Signed)
Requested Prescriptions  Pending Prescriptions Disp Refills  . levothyroxine (SYNTHROID, LEVOTHROID) 75 MCG tablet 90 tablet 3    Sig: Take 1 tablet (75 mcg total) by mouth daily.     Endocrinology:  Hypothyroid Agents Failed - 03/14/2019  9:43 AM      Failed - TSH needs to be rechecked within 3 months after an abnormal result. Refill until TSH is due.      Passed - TSH in normal range and within 360 days    TSH  Date Value Ref Range Status  07/21/2018 1.44 0.41 - 5.90 Final         Passed - Valid encounter within last 12 months    Recent Outpatient Visits          7 months ago Annual physical exam   [facility] Trinna Post, PA-C   2 years ago Acute lower UTI   [facility], [place], [place]   2 years ago Need for diphtheria-tetanus-pertussis (Tdap) vaccine, adult/adolescent   [facility] Kathrine Haddock, NP   3 years ago Cough   [place], NP   3 years ago Breast lump on left side at 10 o'clock position   [facility] Kathrine Haddock, NP

## 2019-03-30 ENCOUNTER — Other Ambulatory Visit: Payer: Self-pay | Admitting: Unknown Physician Specialty

## 2019-03-30 NOTE — Telephone Encounter (Signed)
For further refills on this medication will need to schedule appointment with provider for annual physical in August.

## 2019-03-30 NOTE — Telephone Encounter (Signed)
Called pt to relay information from [place] pt was not at home, left message to call office back.

## 2019-05-31 ENCOUNTER — Ambulatory Visit: Payer: Self-pay | Admitting: *Deleted

## 2019-05-31 NOTE — Telephone Encounter (Signed)
Pain started yesterday on the right upper quad of her abd and went towards her stomach area. She stated it was a burning pain. She denies a rash, redness, fever, chest pain or shortness of breath, diarrhea or nausea or vomiting. Advised pt of having a virtual appointment. She voiced understanding.  [facility] notified of request for an appointment. Call transferred to the office. Routing triage to the office for review.  Reason for Disposition . [1] MILD pain (e.g., does not interfere with normal activities) AND [2] comes and goes (cramps) AND [3] present > 72 hours  Answer Assessment - Initial Assessment Questions 1. LOCATION: "Where does it hurt?"      Right upper quad pain 2. RADIATION: "Does the pain shoot anywhere else?" (e.g., chest, back)     Sometimes goes to the center of stomach 3. ONSET: "When did the pain begin?" (e.g., minutes, hours or days ago)      yesterday 4. SUDDEN: "Gradual or sudden onset?"     gradual 5. PATTERN "Does the pain come and go, or is it constant?"    - If constant: "Is it getting better, staying the same, or worsening?"      (Note: Constant means the pain never goes away completely; most serious pain is constant and it progresses)     - If intermittent: "How long does it last?" "Do you have pain now?"     (Note: Intermittent means the pain goes away completely between bouts)     Comes and goes when she moves a certain way 6. SEVERITY: "How bad is the pain?"  (e.g., Scale 1-10; mild, moderate, or severe)    - MILD (1-3): doesn't interfere with normal activities, abdomen soft and not tender to touch     - MODERATE (4-7): interferes with normal activities or awakens from sleep, tender to touch     - SEVERE (8-10): excruciating pain, doubled over, unable to do any normal activities       Burning sensation 7. RECURRENT SYMPTOM: "Have you ever had this type of abdominal pain before?" If so, ask: "When was the last time?" and "What happened that  time?"      no 8. AGGRAVATING FACTORS: "Does anything seem to cause this pain?" (e.g., foods, stress, alcohol)     no 9. CARDIAC SYMPTOMS: "Do you have any of the following symptoms: chest pain, difficulty breathing, sweating, nausea?"     no 10. OTHER SYMPTOMS: "Do you have any other symptoms?" (e.g., fever, vomiting, diarrhea)       no 11. PREGNANCY: "Is there any chance you are pregnant?" "When was your last menstrual period?"       Not pregnant. LMP sometime around the 10 th of June  Protocols used: ABDOMINAL PAIN - UPPER-A-AH

## 2019-06-01 ENCOUNTER — Ambulatory Visit (INDEPENDENT_AMBULATORY_CARE_PROVIDER_SITE_OTHER): Payer: 59 | Admitting: Family Medicine

## 2019-06-01 ENCOUNTER — Other Ambulatory Visit: Payer: Self-pay

## 2019-06-01 ENCOUNTER — Encounter: Payer: Self-pay | Admitting: Family Medicine

## 2019-06-01 VITALS — BP 134/88 | HR 73 | Temp 98.6°F | Ht 65.0 in | Wt 212.5 lb

## 2019-06-01 DIAGNOSIS — R1011 Right upper quadrant pain: Secondary | ICD-10-CM

## 2019-06-01 NOTE — Progress Notes (Signed)
BP 134/88 (BP Location: Right Arm, Patient Position: Sitting, Cuff Size: Large)   Pulse 73   Temp 98.6 F (37 C) (Oral)   Ht 5\' 5"  (1.651 m)   Wt 212 lb 8 oz (96.4 kg)   SpO2 97%   BMI 35.36 kg/m    Subjective:    Patient ID: Amy Gilbert, female    DOB: Feb 29, 1966, 53 y.o.   MRN: [id number]  HPI: Amy Gilbert is a 53 y.o. female  Chief Complaint  Patient presents with  . Abdominal Pain    Upper right quadrant pain, acute. Pt noticied day before yesterday   Presenting today for 2 days of RUQ pain. Comes and goes throughout the day, aching RUQ pain that at first felt like something was stinging her in the area but now feels deeper down. Has not noticed any rash, particular patterns or exacerbating factors. Pain is much less today than yesterday. Denies N/V/D, fevers, chills, anorexia, hx of gallbladder issues, reflux sxs. Has not tried anything for relief other than some gas-x which didn't seem to help.   Relevant past medical, surgical, family and social history reviewed and updated as indicated. Interim medical history since our last visit reviewed. Allergies and medications reviewed and updated.  Review of Systems  Per HPI unless specifically indicated above     Objective:    BP 134/88 (BP Location: Right Arm, Patient Position: Sitting, Cuff Size: Large)   Pulse 73   Temp 98.6 F (37 C) (Oral)   Ht 5\' 5"  (1.651 m)   Wt 212 lb 8 oz (96.4 kg)   SpO2 97%   BMI 35.36 kg/m   Wt Readings from Last 3 Encounters:  06/01/19 212 lb 8 oz (96.4 kg)  08/04/18 216 lb (98 kg)  07/20/18 216 lb 9.6 oz (98.2 kg)    Physical Exam Vitals signs and nursing note reviewed.  Constitutional:      Appearance: Normal appearance. She is not ill-appearing.  HENT:     Head: Atraumatic.  Eyes:     Extraocular Movements: Extraocular movements intact.     Conjunctiva/sclera: Conjunctivae normal.  Neck:     Musculoskeletal: Normal range of motion and neck supple.   Cardiovascular:     Rate and Rhythm: Normal rate and regular rhythm.     Heart sounds: Normal heart sounds.  Pulmonary:     Effort: Pulmonary effort is normal.     Breath sounds: Normal breath sounds.  Abdominal:     General: Bowel sounds are normal.     Palpations: Abdomen is soft.     Tenderness: There is abdominal tenderness (minimal RUQ ttp). There is no right CVA tenderness, left CVA tenderness, guarding or rebound.     Comments: - murphy's sign  Musculoskeletal: Normal range of motion.  Skin:    General: Skin is warm and dry.  Neurological:     Mental Status: She is alert and oriented to person, place, and time.  Psychiatric:        Mood and Affect: Mood normal.        Thought Content: Thought content normal.        Judgment: Judgment normal.     Results for orders placed or performed in visit on 06/01/19  Comprehensive metabolic panel  Result Value Ref Range   Glucose 92 65 - 99 mg/dL   BUN 14 6 - 24 mg/dL   Creatinine, Ser 0.77 0.57 - 1.00 mg/dL   GFR calc non Af Wyvonnia Lora  90 >59 mL/min/1.73   GFR calc Af Amer 104 >59 mL/min/1.73   BUN/Creatinine Ratio 18 9 - 23   Sodium 140 134 - 144 mmol/L   Potassium 4.7 3.5 - 5.2 mmol/L   Chloride 100 96 - 106 mmol/L   CO2 22 20 - 29 mmol/L   Calcium 10.0 8.7 - 10.2 mg/dL   Total Protein 7.4 6.0 - 8.5 g/dL   Albumin 4.8 3.8 - 4.8 g/dL   Globulin, Total 2.6 1.5 - 4.5 g/dL   Albumin/Globulin Ratio 1.8 1.2 - 2.2   Bilirubin Total 0.4 0.0 - 1.2 mg/dL   Alkaline Phosphatase 103 39 - 117 IU/L   AST 23 0 - 40 IU/L   ALT 12 0 - 32 IU/L  Lipase  Result Value Ref Range   Lipase 27 14 - 72 U/L      Assessment & Plan:   Problem List Items Addressed This Visit    None    Visit Diagnoses    RUQ pain    -  Primary   Relevant Orders   Comprehensive metabolic panel (Completed)   Lipase (Completed)    Will check lipase and CMP, declines U/S for now but will call back if sxs not improving. Discussed possibility of herpes neuralgia  given initial superficial presentation, but pain seems to be resolving on it's own. Reduce fatty food intake in case gallbladder related. Strict return precautions given.    Follow up plan: Return for CPE.

## 2019-06-02 LAB — COMPREHENSIVE METABOLIC PANEL
ALT: 12 IU/L (ref 0–32)
AST: 23 IU/L (ref 0–40)
Albumin/Globulin Ratio: 1.8 (ref 1.2–2.2)
Albumin: 4.8 g/dL (ref 3.8–4.8)
Alkaline Phosphatase: 103 IU/L (ref 39–117)
BUN/Creatinine Ratio: 18 (ref 9–23)
BUN: 14 mg/dL (ref 6–24)
Bilirubin Total: 0.4 mg/dL (ref 0.0–1.2)
CO2: 22 mmol/L (ref 20–29)
Calcium: 10 mg/dL (ref 8.7–10.2)
Chloride: 100 mmol/L (ref 96–106)
Creatinine, Ser: 0.77 mg/dL (ref 0.57–1.00)
GFR calc Af Amer: 104 mL/min/{1.73_m2} (ref >59)
GFR calc non Af Amer: 90 mL/min/{1.73_m2} (ref >59)
Globulin, Total: 2.6 g/dL (ref 1.5–4.5)
Glucose: 92 mg/dL (ref 65–99)
Potassium: 4.7 mmol/L (ref 3.5–5.2)
Sodium: 140 mmol/L (ref 134–144)
Total Protein: 7.4 g/dL (ref 6.0–8.5)

## 2019-06-02 LAB — LIPASE: Lipase: 27 U/L (ref 14–72)

## 2019-06-07 ENCOUNTER — Telehealth: Payer: Self-pay | Admitting: Family Medicine

## 2019-06-07 DIAGNOSIS — R1011 Right upper quadrant pain: Secondary | ICD-10-CM

## 2019-06-07 NOTE — Telephone Encounter (Signed)
Called and left detailed message with information on patients vm. DPR was checked.   

## 2019-06-07 NOTE — Telephone Encounter (Signed)
Order placed  Copied from [place] [phone]. Topic: General - Other >> Jun 03, 2019  8:15 AM Leward Quan A wrote: Reason for CRM: Patient called to say that she would like to be scheduled for the ultrasound discussed with Apolonio Schneiders on last visit she is still having abdominal pains. Ph# [phone]

## 2019-06-13 ENCOUNTER — Ambulatory Visit
Admission: RE | Admit: 2019-06-13 | Discharge: 2019-06-13 | Disposition: A | Discharge status: Home / Self Care | Payer: 59 | Source: Ambulatory Visit | Attending: Family Medicine | Admitting: Family Medicine

## 2019-06-13 ENCOUNTER — Other Ambulatory Visit: Payer: Self-pay

## 2019-06-13 DIAGNOSIS — R1011 Right upper quadrant pain: Secondary | ICD-10-CM | POA: Diagnosis not present

## 2019-06-13 DIAGNOSIS — K824 Cholesterolosis of gallbladder: Secondary | ICD-10-CM | POA: Diagnosis not present

## 2019-06-19 ENCOUNTER — Other Ambulatory Visit: Payer: Self-pay | Admitting: Nurse Practitioner

## 2019-06-19 NOTE — Telephone Encounter (Signed)
30 day courtesy refill Requested Prescriptions  Pending Prescriptions Disp Refills  . citalopram (CELEXA) 20 MG tablet [Pharmacy Med Name: CITALOPRAM TAB 20MG ] 30 tablet 0    Sig: TAKE 1 TABLET DAILY - NEED APPOINTMENT WITH PROVIDER  FOR FURTHER REFILLS     Psychiatry:  Antidepressants - SSRI Passed - 06/19/2019  7:13 AM      Passed - Valid encounter within last 6 months    Recent Outpatient Visits          2 weeks ago RUQ pain   [facility] Merrie Roof [place], [place]   11 months ago Annual physical exam   [facility] Carles Collet [place], [place]   2 years ago Acute lower UTI   [facility], [place], [place]   2 years ago Need for diphtheria-tetanus-pertussis (Tdap) vaccine, adult/adolescent   [facility] Kathrine Haddock, NP   3 years ago Cough   [place], NP      Future Appointments            In 66 month Orene Desanctis, Lilia Argue, [place], [facility]           Passed - Completed PHQ-2 or PHQ-9 in the last 360 days.

## 2019-07-18 ENCOUNTER — Other Ambulatory Visit: Payer: Self-pay | Admitting: Family Medicine

## 2019-08-01 ENCOUNTER — Encounter: Payer: Self-pay | Admitting: Family Medicine

## 2019-08-21 ENCOUNTER — Other Ambulatory Visit: Payer: Self-pay | Admitting: Family Medicine

## 2019-09-09 ENCOUNTER — Ambulatory Visit (INDEPENDENT_AMBULATORY_CARE_PROVIDER_SITE_OTHER): Payer: 59 | Admitting: Family Medicine

## 2019-09-09 ENCOUNTER — Other Ambulatory Visit: Payer: Self-pay

## 2019-09-09 ENCOUNTER — Encounter: Payer: Self-pay | Admitting: Family Medicine

## 2019-09-09 VITALS — BP 139/90 | HR 91 | Temp 98.6°F | Ht 64.7 in | Wt 220.4 lb

## 2019-09-09 DIAGNOSIS — F322 Major depressive disorder, single episode, severe without psychotic features: Secondary | ICD-10-CM

## 2019-09-09 DIAGNOSIS — Z0000 Encounter for general adult medical examination without abnormal findings: Secondary | ICD-10-CM | POA: Diagnosis not present

## 2019-09-09 DIAGNOSIS — E7800 Pure hypercholesterolemia, unspecified: Secondary | ICD-10-CM | POA: Diagnosis not present

## 2019-09-09 DIAGNOSIS — J3089 Other allergic rhinitis: Secondary | ICD-10-CM | POA: Diagnosis not present

## 2019-09-09 DIAGNOSIS — E669 Obesity, unspecified: Secondary | ICD-10-CM | POA: Diagnosis not present

## 2019-09-09 DIAGNOSIS — E030 Congenital hypothyroidism with diffuse goiter: Secondary | ICD-10-CM | POA: Diagnosis not present

## 2019-09-09 DIAGNOSIS — R69 Illness, unspecified: Secondary | ICD-10-CM | POA: Diagnosis not present

## 2019-09-09 DIAGNOSIS — J309 Allergic rhinitis, unspecified: Secondary | ICD-10-CM | POA: Insufficient documentation

## 2019-09-09 NOTE — Assessment & Plan Note (Signed)
Recheck TSH, adjust as needed. Continue current regimen 

## 2019-09-09 NOTE — Assessment & Plan Note (Signed)
Diet and exercise counseling provided

## 2019-09-09 NOTE — Assessment & Plan Note (Signed)
Stable and under good control, continue current regimen 

## 2019-09-09 NOTE — Progress Notes (Signed)
BP 139/90   Pulse 91   Temp 98.6 F (37 C) (Oral)   Ht 5' 4.7" (1.643 m)   Wt 220 lb 6.4 oz (100 kg)   LMP 07/10/2019 (Approximate)   SpO2 97%   BMI 37.02 kg/m    Subjective:    Patient ID: Amy Gilbert, female    DOB: [birth year], 53 y.o.   MRN: [id number]  HPI: Amy Gilbert is a 53 y.o. female presenting on 09/09/2019 for comprehensive medical examination. Current medical complaints include:see below  Hypothyroid - on synthroid, tolerating well and taking faithfully. Feels well overall.   Depression - moods doing very well with celexa, denies significant mood swings, crying spells, SI/HI, side effects.   HLD - diet controlled, no CP, SOB, claudication.   Allergies under good control with nasal spray and zyrtec daily.   Gets labs done at [facility] on [address] in [place]. Needs orders faxed over.   She currently lives with: Menopausal Symptoms: no  Depression Screen done today and results listed below:  Depression screen [facility] 2/9 09/09/2019 07/20/2018 02/06/2017 01/29/2016  Decreased Interest 0 0 0 0  Down, Depressed, Hopeless 0 0 0 0  PHQ - 2 Score 0 0 0 0  Altered sleeping 0 2 - -  Tired, decreased energy 0 2 - -  Change in appetite 0 2 - -  Feeling bad or failure about yourself  0 0 - -  Trouble concentrating 0 0 - -  Moving slowly or fidgety/restless 0 0 - -  Suicidal thoughts 0 0 - -  PHQ-9 Score 0 6 - -  Difficult doing work/chores Not difficult at all - - -    The patient does not have a history of falls. I did complete a risk assessment for falls. A plan of care for falls was documented.   Past Medical History:  Past Medical History:  Diagnosis Date  . Anxiety   . Depression   . Lumbago   . Malaise and fatigue   . Sciatica   . Thyroid disease     Surgical History:  Past Surgical History:  Procedure Laterality Date  . COLONOSCOPY WITH PROPOFOL N/A 08/04/2018   Procedure: COLONOSCOPY WITH PROPOFOL;  Surgeon: Virgel Manifold, MD;   Location: [facility] ENDOSCOPY;  Service: Endoscopy;  Laterality: N/A;  . DILATION AND CURETTAGE OF UTERUS    . TUBAL LIGATION      Medications:  Current Outpatient Medications on File Prior to Visit  Medication Sig  . cetirizine (ZYRTEC) 10 MG tablet Take 10 mg by mouth daily.  . citalopram (CELEXA) 20 MG tablet TAKE 1 TABLET DAILY *PLEASE KEEP UPCOMING APPOINTMENT*  . SYNTHROID 75 MCG tablet TAKE 1 TABLET DAILY  . triamcinolone (NASACORT ALLERGY 24HR) 55 MCG/ACT AERO nasal inhaler Place 2 sprays into the nose daily.  . TURMERIC PO Take by mouth daily.   No current facility-administered medications on file prior to visit.     Allergies:  No Known Allergies  Social History:  Social History   Socioeconomic History  . Marital status: Single    Spouse name: Not on file  . Number of children: Not on file  . Years of education: Not on file  . Highest education level: Not on file  Occupational History  . Not on file  Social Needs  . Financial resource strain: Not on file  . Food insecurity    Worry: Not on file    Inability: Not on file  . Transportation  needs    Medical: Not on file    Non-medical: Not on file  Tobacco Use  . Smoking status: Never Smoker  . Smokeless tobacco: Never Used  Substance and Sexual Activity  . Alcohol use: Yes    Alcohol/week: 0.0 standard drinks    Comment: rarely  . Drug use: No  . Sexual activity: Yes  Lifestyle  . Physical activity    Days per week: Not on file    Minutes per session: Not on file  . Stress: Not on file  Relationships  . Social Herbalist on phone: Not on file    Gets together: Not on file    Attends religious service: Not on file    Active member of club or organization: Not on file    Attends meetings of clubs or organizations: Not on file    Relationship status: Not on file  . Intimate partner violence    Fear of current or ex partner: Not on file    Emotionally abused: Not on file    Physically abused:  Not on file    Forced sexual activity: Not on file  Other Topics Concern  . Not on file  Social History Narrative  . Not on file   Social History   Tobacco Use  Smoking Status Never Smoker  Smokeless Tobacco Never Used   Social History   Substance and Sexual Activity  Alcohol Use Yes  . Alcohol/week: 0.0 standard drinks   Comment: rarely    Family History:  Family History  Problem Relation Age of Onset  . Cancer Mother        liver age 66's  . Lung disease Mother   . Cancer Father        non hodgkins lymphoma  . Hypertension Father   . Diabetes Maternal Grandmother   . Cancer Maternal Grandfather        lung  . Diabetes Paternal Grandmother     Past medical history, surgical history, medications, allergies, family history and social history reviewed with patient today and changes made to appropriate areas of the chart.   Review of Systems - General ROS: negative Psychological ROS: negative Ophthalmic ROS: negative ENT ROS: negative Allergy and Immunology ROS: negative Hematological and Lymphatic ROS: negative Endocrine ROS: negative Breast ROS: negative for breast lumps Respiratory ROS: no cough, shortness of breath, or wheezing Cardiovascular ROS: no chest pain or dyspnea on exertion Gastrointestinal ROS: no abdominal pain, change in bowel habits, or black or bloody stools Genito-Urinary ROS: no dysuria, trouble voiding, or hematuria Musculoskeletal ROS: negative Neurological ROS: no TIA or stroke symptoms Dermatological ROS: negative All other ROS negative except what is listed above and in the HPI.      Objective:    BP 139/90   Pulse 91   Temp 98.6 F (37 C) (Oral)   Ht 5' 4.7" (1.643 m)   Wt 220 lb 6.4 oz (100 kg)   LMP 07/10/2019 (Approximate)   SpO2 97%   BMI 37.02 kg/m   Wt Readings from Last 3 Encounters:  09/09/19 220 lb 6.4 oz (100 kg)  06/01/19 212 lb 8 oz (96.4 kg)  08/04/18 216 lb (98 kg)    Physical Exam Vitals signs and nursing  note reviewed.  Constitutional:      General: She is not in acute distress.    Appearance: She is well-developed.  HENT:     Head: Atraumatic.     Right Ear: External ear  normal.     Left Ear: External ear normal.     Nose: Nose normal.     Mouth/Throat:     Pharynx: No oropharyngeal exudate.  Eyes:     General: No scleral icterus.    Conjunctiva/sclera: Conjunctivae normal.     Pupils: Pupils are equal, round, and reactive to light.  Neck:     Musculoskeletal: Normal range of motion and neck supple.     Thyroid: No thyromegaly.  Cardiovascular:     Rate and Rhythm: Normal rate and regular rhythm.     Heart sounds: Normal heart sounds.  Pulmonary:     Effort: Pulmonary effort is normal. No respiratory distress.     Breath sounds: Normal breath sounds.  Chest:     Breasts:        Right: No mass, skin change or tenderness.        Left: No mass, skin change or tenderness.  Abdominal:     General: Bowel sounds are normal.     Palpations: Abdomen is soft. There is no mass.     Tenderness: There is no abdominal tenderness.  Genitourinary:    Comments: GU exam declined Musculoskeletal: Normal range of motion.        General: No tenderness.  Lymphadenopathy:     Cervical: No cervical adenopathy.     Upper Body:     Right upper body: No axillary adenopathy.     Left upper body: No axillary adenopathy.  Skin:    General: Skin is warm and dry.     Findings: No rash.  Neurological:     Mental Status: She is alert and oriented to person, place, and time.     Cranial Nerves: No cranial nerve deficit.  Psychiatric:        Behavior: Behavior normal.     Results for orders placed or performed in visit on 06/01/19  Comprehensive metabolic panel  Result Value Ref Range   Glucose 92 65 - 99 mg/dL   BUN 14 6 - 24 mg/dL   Creatinine, Ser 0.77 0.57 - 1.00 mg/dL   GFR calc non Af Amer 90 >59 mL/min/1.73   GFR calc Af Amer 104 >59 mL/min/1.73   BUN/Creatinine Ratio 18 9 - 23    Sodium 140 134 - 144 mmol/L   Potassium 4.7 3.5 - 5.2 mmol/L   Chloride 100 96 - 106 mmol/L   CO2 22 20 - 29 mmol/L   Calcium 10.0 8.7 - 10.2 mg/dL   Total Protein 7.4 6.0 - 8.5 g/dL   Albumin 4.8 3.8 - 4.8 g/dL   Globulin, Total 2.6 1.5 - 4.5 g/dL   Albumin/Globulin Ratio 1.8 1.2 - 2.2   Bilirubin Total 0.4 0.0 - 1.2 mg/dL   Alkaline Phosphatase 103 39 - 117 IU/L   AST 23 0 - 40 IU/L   ALT 12 0 - 32 IU/L  Lipase  Result Value Ref Range   Lipase 27 14 - 72 U/L      Assessment & Plan:   Problem List Items Addressed This Visit      Respiratory   Allergic rhinitis    Stable and under good control, continue current regimen        Endocrine   Hypothyroidism - Primary    Recheck TSH, adjust as needed. Continue current regimen        Other   Severe depression (HCC)    Chronic. Moods stable and well controlled, continue current regimen  Hyperlipidemia    Recheck lipids, adjust as needed. Lifestyle modifications reviewed      Obesity (BMI 35.0-39.9 without comorbidity)    Diet and exercise counseling provided       Other Visit Diagnoses    Annual physical exam       Orders for labs faxed to [facility] per patient request, she will schedule appt       Follow up plan: Return in about 6 months (around 03/09/2020) for 6 month f/u.   LABORATORY TESTING:  - Pap smear: up to date  IMMUNIZATIONS:   - Tdap: Tetanus vaccination status reviewed: last tetanus booster within 10 years. - Influenza: Up to date  SCREENING: -Mammogram: Ordered today  - Colonoscopy: Up to date   PATIENT COUNSELING:   Advised to take 1 mg of folate supplement per day if capable of pregnancy.   Sexuality: Discussed sexually transmitted diseases, partner selection, use of condoms, avoidance of unintended pregnancy  and contraceptive alternatives.   Advised to avoid cigarette smoking.  I discussed with the patient that most people either abstain from alcohol or drink within safe limits  (<=14/week and <=4 drinks/occasion for males, <=7/weeks and <= 3 drinks/occasion for females) and that the risk for alcohol disorders and other health effects rises proportionally with the number of drinks per week and how often a drinker exceeds daily limits.  Discussed cessation/primary prevention of drug use and availability of treatment for abuse.   Diet: Encouraged to adjust caloric intake to maintain  or achieve ideal body weight, to reduce intake of dietary saturated fat and total fat, to limit sodium intake by avoiding high sodium foods and not adding table salt, and to maintain adequate dietary potassium and calcium preferably from fresh fruits, vegetables, and low-fat dairy products.    stressed the importance of regular exercise  Injury prevention: Discussed safety belts, safety helmets, smoke detector, smoking near bedding or upholstery.   Dental health: Discussed importance of regular tooth brushing, flossing, and dental visits.    NEXT PREVENTATIVE PHYSICAL DUE IN 1 YEAR. Return in about 6 months (around 03/09/2020) for 6 month f/u.

## 2019-09-09 NOTE — Assessment & Plan Note (Signed)
Chronic. Moods stable and well controlled, continue current regimen

## 2019-09-09 NOTE — Assessment & Plan Note (Signed)
Recheck lipids, adjust as needed. Lifestyle modifications reviewed 

## 2019-09-14 ENCOUNTER — Other Ambulatory Visit: Payer: Self-pay | Admitting: Family Medicine

## 2019-10-14 ENCOUNTER — Other Ambulatory Visit: Payer: Self-pay | Admitting: Family Medicine

## 2019-11-06 DIAGNOSIS — N390 Urinary tract infection, site not specified: Secondary | ICD-10-CM | POA: Diagnosis not present

## 2019-11-12 ENCOUNTER — Other Ambulatory Visit: Payer: Self-pay | Admitting: Family Medicine

## 2019-11-12 NOTE — Telephone Encounter (Signed)
Needs appt, has not been seen other than 1 acute since last Aug. Will give 2 weeks to bridge. Can be virtual.

## 2019-11-12 NOTE — Telephone Encounter (Signed)
Forwarding medication refill request to PCP for review. 

## 2019-11-14 NOTE — Telephone Encounter (Signed)
Pt scheduled for 01/06, that was her first day off work.

## 2019-12-07 ENCOUNTER — Ambulatory Visit: Payer: 59 | Admitting: Family Medicine

## 2019-12-07 ENCOUNTER — Other Ambulatory Visit: Payer: Self-pay | Admitting: Family Medicine

## 2019-12-07 ENCOUNTER — Other Ambulatory Visit: Payer: Self-pay

## 2019-12-07 MED ORDER — LEVOTHYROXINE SODIUM 75 MCG PO TABS: 75.0000 ug | ORAL_TABLET | Freq: Every day | ORAL | 1 refills | Status: DC

## 2019-12-07 MED ORDER — CITALOPRAM HYDROBROMIDE 20 MG PO TABS: 20.0000 mg | ORAL_TABLET | Freq: Every day | ORAL | 1 refills | Status: DC

## 2020-03-23 ENCOUNTER — Encounter: Payer: Self-pay | Admitting: Family Medicine

## 2020-03-23 ENCOUNTER — Other Ambulatory Visit: Payer: Self-pay

## 2020-03-23 ENCOUNTER — Ambulatory Visit (INDEPENDENT_AMBULATORY_CARE_PROVIDER_SITE_OTHER): Payer: 59 | Admitting: Family Medicine

## 2020-03-23 VITALS — BP 129/85 | HR 78 | Temp 98.3°F | Wt 224.0 lb

## 2020-03-23 DIAGNOSIS — F322 Major depressive disorder, single episode, severe without psychotic features: Secondary | ICD-10-CM | POA: Diagnosis not present

## 2020-03-23 DIAGNOSIS — J3089 Other allergic rhinitis: Secondary | ICD-10-CM

## 2020-03-23 DIAGNOSIS — E030 Congenital hypothyroidism with diffuse goiter: Secondary | ICD-10-CM | POA: Diagnosis not present

## 2020-03-23 DIAGNOSIS — R5383 Other fatigue: Secondary | ICD-10-CM | POA: Diagnosis not present

## 2020-03-23 DIAGNOSIS — E7800 Pure hypercholesterolemia, unspecified: Secondary | ICD-10-CM

## 2020-03-23 DIAGNOSIS — Z1231 Encounter for screening mammogram for malignant neoplasm of breast: Secondary | ICD-10-CM | POA: Diagnosis not present

## 2020-03-23 DIAGNOSIS — R69 Illness, unspecified: Secondary | ICD-10-CM | POA: Diagnosis not present

## 2020-03-23 MED ORDER — CITALOPRAM HYDROBROMIDE 20 MG PO TABS: 20.0000 mg | ORAL_TABLET | Freq: Every day | ORAL | 1 refills | Status: DC

## 2020-03-23 MED ORDER — LEVOTHYROXINE SODIUM 75 MCG PO TABS: 75.0000 ug | ORAL_TABLET | Freq: Every day | ORAL | 1 refills | Status: DC

## 2020-03-23 NOTE — Patient Instructions (Signed)
Please call this number to schedule your mammogram. [phone] 

## 2020-03-23 NOTE — Progress Notes (Signed)
BP 129/85   Pulse 78   Temp 98.3 F (36.8 C) (Oral)   Wt 224 lb (101.6 kg)   SpO2 96%   BMI 37.62 kg/m    Subjective:    Patient ID: Amy Gilbert, female    DOB: 07-Feb-1966, 54 y.o.   MRN: [id number]  HPI: Amy Gilbert is a 54 y.o. female  Chief Complaint  Patient presents with  . Hypothyroidism  . Hyperlipidemia  . Depression   Presenting today for 6 month f/u chronic conditions.   Feeling more tired than usual, wondering if her thyroid is off. Taking medications faithfully without side effects. Overall feels her celexa is helping moods, denies mood swings, anxiety attacks, Si/HI. Has a hx of HLD but both are currently diet controlled. States she's fallen off the wagon with diet during pandemic but recently started a walking program and trying to eat better.   Depression screen [facility] 2/9 03/23/2020 09/09/2019 07/20/2018  Decreased Interest 0 0 0  Down, Depressed, Hopeless 0 0 0  PHQ - 2 Score 0 0 0  Altered sleeping 1 0 2  Tired, decreased energy 2 0 2  Change in appetite 2 0 2  Feeling bad or failure about yourself  0 0 0  Trouble concentrating 0 0 0  Moving slowly or fidgety/restless 0 0 0  Suicidal thoughts 0 0 0  PHQ-9 Score 5 0 6  Difficult doing work/chores - Not difficult at all -   GAD 7 : Generalized Anxiety Score 09/09/2019  Nervous, Anxious, on Edge 0  Control/stop worrying 0  Worry too much - different things 0  Trouble relaxing 0  Restless 0  Easily annoyed or irritable 0  Afraid - awful might happen 0  Total GAD 7 Score 0  Anxiety Difficulty Not difficult at all   Relevant past medical, surgical, family and social history reviewed and updated as indicated. Interim medical history since our last visit reviewed. Allergies and medications reviewed and updated.  Review of Systems  Per HPI unless specifically indicated above     Objective:    BP 129/85   Pulse 78   Temp 98.3 F (36.8 C) (Oral)   Wt 224 lb (101.6 kg)   SpO2 96%    BMI 37.62 kg/m   Wt Readings from Last 3 Encounters:  03/23/20 224 lb (101.6 kg)  09/09/19 220 lb 6.4 oz (100 kg)  06/01/19 212 lb 8 oz (96.4 kg)    Physical Exam Vitals and nursing note reviewed.  Constitutional:      Appearance: Normal appearance. She is not ill-appearing.  HENT:     Head: Atraumatic.  Eyes:     Extraocular Movements: Extraocular movements intact.     Conjunctiva/sclera: Conjunctivae normal.  Cardiovascular:     Rate and Rhythm: Normal rate and regular rhythm.     Heart sounds: Normal heart sounds.  Pulmonary:     Effort: Pulmonary effort is normal.     Breath sounds: Normal breath sounds.  Musculoskeletal:        General: Normal range of motion.     Cervical back: Normal range of motion and neck supple.  Skin:    General: Skin is warm and dry.  Neurological:     Mental Status: She is alert and oriented to person, place, and time.  Psychiatric:        Mood and Affect: Mood normal.        Thought Content: Thought content normal.  Judgment: Judgment normal.     Results for orders placed or performed in visit on 03/23/20  Comprehensive metabolic panel  Result Value Ref Range   Glucose 73 65 - 99 mg/dL   BUN 11 6 - 24 mg/dL   Creatinine, Ser 0.82 0.57 - 1.00 mg/dL   GFR calc non Af Amer 83 >59 mL/min/1.73   GFR calc Af Amer 96 >59 mL/min/1.73   BUN/Creatinine Ratio 13 9 - 23   Sodium 140 134 - 144 mmol/L   Potassium 4.3 3.5 - 5.2 mmol/L   Chloride 101 96 - 106 mmol/L   CO2 21 20 - 29 mmol/L   Calcium 9.9 8.7 - 10.2 mg/dL   Total Protein 7.3 6.0 - 8.5 g/dL   Albumin 4.8 3.8 - 4.9 g/dL   Globulin, Total 2.5 1.5 - 4.5 g/dL   Albumin/Globulin Ratio 1.9 1.2 - 2.2   Bilirubin Total 0.3 0.0 - 1.2 mg/dL   Alkaline Phosphatase 114 39 - 117 IU/L   AST 22 0 - 40 IU/L   ALT 8 0 - 32 IU/L  Lipid Panel w/o Chol/HDL Ratio  Result Value Ref Range   Cholesterol, Total 266 (H) 100 - 199 mg/dL   Triglycerides 242 (H) 0 - 149 mg/dL   HDL 60 >39 mg/dL    VLDL Cholesterol Cal 45 (H) 5 - 40 mg/dL   LDL Chol Calc (NIH) 161 (H) 0 - 99 mg/dL  TSH  Result Value Ref Range   TSH 2.230 0.450 - 4.500 uIU/mL  VITAMIN D 25 Hydroxy (Vit-D Deficiency, Fractures)  Result Value Ref Range   Vit D, 25-Hydroxy 28.1 (L) 30.0 - 100.0 ng/mL  CBC with Differential/Platelet  Result Value Ref Range   WBC 9.1 3.4 - 10.8 x10E3/uL   RBC 4.85 3.77 - 5.28 x10E6/uL   Hemoglobin 14.0 11.1 - 15.9 g/dL   Hematocrit 42.4 34.0 - 46.6 %   MCV 87 79 - 97 fL   MCH 28.9 26.6 - 33.0 pg   MCHC 33.0 31.5 - 35.7 g/dL   RDW 13.4 11.7 - 15.4 %   Platelets 481 (H) 150 - 450 x10E3/uL   Neutrophils 48 Not Estab. %   Lymphs 41 Not Estab. %   Monocytes 8 Not Estab. %   Eos 2 Not Estab. %   Basos 1 Not Estab. %   Neutrophils Absolute 4.4 1.4 - 7.0 x10E3/uL   Lymphocytes Absolute 3.7 (H) 0.7 - 3.1 x10E3/uL   Monocytes Absolute 0.7 0.1 - 0.9 x10E3/uL   EOS (ABSOLUTE) 0.1 0.0 - 0.4 x10E3/uL   Basophils Absolute 0.1 0.0 - 0.2 x10E3/uL   Immature Granulocytes 0 Not Estab. %   Immature Grans (Abs) 0.0 0.0 - 0.1 x10E3/uL      Assessment & Plan:   Problem List Items Addressed This Visit      Respiratory   Allergic rhinitis    Stable and well controlled, continue good allergy regimen        Endocrine   Hypothyroidism    Recheck TSH, adjust dose if needed      Relevant Medications   levothyroxine (SYNTHROID) 75 MCG tablet   Other Relevant Orders   TSH (Completed)     Other   Severe depression (HCC)    Stable and well controlled, continue current regimen      Relevant Medications   citalopram (CELEXA) 20 MG tablet   Hyperlipidemia - Primary    Recheck lipids, adjust as needed. Continue working on lifestyle modifications  Relevant Orders   Lipid Panel w/o Chol/HDL Ratio (Completed)    Other Visit Diagnoses    Fatigue, unspecified type       Relevant Orders   Comprehensive metabolic panel (Completed)   VITAMIN D 25 Hydroxy (Vit-D Deficiency, Fractures)  (Completed)   CBC with Differential/Platelet (Completed)   Encounter for screening mammogram for malignant neoplasm of breast       Relevant Orders   MM 3D SCREEN BREAST BILATERAL       Follow up plan: Return in about 6 months (around 09/22/2020) for CPE.

## 2020-03-24 LAB — COMPREHENSIVE METABOLIC PANEL
ALT: 8 IU/L (ref 0–32)
AST: 22 IU/L (ref 0–40)
Albumin/Globulin Ratio: 1.9 (ref 1.2–2.2)
Albumin: 4.8 g/dL (ref 3.8–4.9)
Alkaline Phosphatase: 114 IU/L (ref 39–117)
BUN/Creatinine Ratio: 13 (ref 9–23)
BUN: 11 mg/dL (ref 6–24)
Bilirubin Total: 0.3 mg/dL (ref 0.0–1.2)
CO2: 21 mmol/L (ref 20–29)
Calcium: 9.9 mg/dL (ref 8.7–10.2)
Chloride: 101 mmol/L (ref 96–106)
Creatinine, Ser: 0.82 mg/dL (ref 0.57–1.00)
GFR calc Af Amer: 96 mL/min/{1.73_m2} (ref >59)
GFR calc non Af Amer: 83 mL/min/{1.73_m2} (ref >59)
Globulin, Total: 2.5 g/dL (ref 1.5–4.5)
Glucose: 73 mg/dL (ref 65–99)
Potassium: 4.3 mmol/L (ref 3.5–5.2)
Sodium: 140 mmol/L (ref 134–144)
Total Protein: 7.3 g/dL (ref 6.0–8.5)

## 2020-03-24 LAB — CBC WITH DIFFERENTIAL/PLATELET
Basophils Absolute: 0.1 10*3/uL (ref 0.0–0.2)
Basos: 1 %
EOS (ABSOLUTE): 0.1 10*3/uL (ref 0.0–0.4)
Eos: 2 %
Hematocrit: 42.4 % (ref 34.0–46.6)
Hemoglobin: 14 g/dL (ref 11.1–15.9)
Immature Grans (Abs): 0 10*3/uL (ref 0.0–0.1)
Immature Granulocytes: 0 %
Lymphocytes Absolute: 3.7 10*3/uL — ABNORMAL HIGH (ref 0.7–3.1)
Lymphs: 41 %
MCH: 28.9 pg (ref 26.6–33.0)
MCHC: 33 g/dL (ref 31.5–35.7)
MCV: 87 fL (ref 79–97)
Monocytes Absolute: 0.7 10*3/uL (ref 0.1–0.9)
Monocytes: 8 %
Neutrophils Absolute: 4.4 10*3/uL (ref 1.4–7.0)
Neutrophils: 48 %
Platelets: 481 10*3/uL — ABNORMAL HIGH (ref 150–450)
RBC: 4.85 x10E6/uL (ref 3.77–5.28)
RDW: 13.4 % (ref 11.7–15.4)
WBC: 9.1 10*3/uL (ref 3.4–10.8)

## 2020-03-24 LAB — LIPID PANEL W/O CHOL/HDL RATIO
Cholesterol, Total: 266 mg/dL — ABNORMAL HIGH (ref 100–199)
HDL: 60 mg/dL (ref >39)
LDL Chol Calc (NIH): 161 mg/dL — ABNORMAL HIGH (ref 0–99)
Triglycerides: 242 mg/dL — ABNORMAL HIGH (ref 0–149)
VLDL Cholesterol Cal: 45 mg/dL — ABNORMAL HIGH (ref 5–40)

## 2020-03-24 LAB — TSH: TSH: 2.23 u[IU]/mL (ref 0.450–4.500)

## 2020-03-24 LAB — VITAMIN D 25 HYDROXY (VIT D DEFICIENCY, FRACTURES): Vit D, 25-Hydroxy: 28.1 ng/mL — ABNORMAL LOW (ref 30.0–100.0)

## 2020-03-28 NOTE — Assessment & Plan Note (Signed)
Recheck TSH, adjust dose if needed

## 2020-03-28 NOTE — Assessment & Plan Note (Signed)
Stable and well controlled, continue good allergy regimen

## 2020-03-28 NOTE — Assessment & Plan Note (Signed)
Recheck lipids, adjust as needed. Continue working on lifestyle modifications 

## 2020-03-28 NOTE — Assessment & Plan Note (Signed)
Stable and well controlled, continue current regimen 

## 2020-07-13 ENCOUNTER — Encounter: Payer: Self-pay | Admitting: Family Medicine

## 2020-08-10 ENCOUNTER — Telehealth: Payer: Self-pay | Admitting: Family Medicine

## 2020-08-10 NOTE — Telephone Encounter (Signed)
Patient last saw Amy Gilbert in April. Has follow up with Janett Billow in November. Do you mind entering her mammogram order please? There is one from [place], but not sure if they will do it with Amy Gilbert no longer being here.

## 2020-08-10 NOTE — Telephone Encounter (Signed)
Patient called to get an order for her to have her mammo at [facility] since she has a new PCP.  Please advise and contact patient once order has been sent at CB# [phone] (

## 2020-08-11 ENCOUNTER — Other Ambulatory Visit: Payer: Self-pay | Admitting: Nurse Practitioner

## 2020-08-11 DIAGNOSIS — Z1231 Encounter for screening mammogram for malignant neoplasm of breast: Secondary | ICD-10-CM

## 2020-08-11 NOTE — Progress Notes (Signed)
Screening mammogram order placed.

## 2020-08-11 NOTE — Telephone Encounter (Signed)
Screening mammogram order placed.

## 2020-08-13 NOTE — Telephone Encounter (Signed)
Patient advised as below.  

## 2020-08-14 DIAGNOSIS — H5203 Hypermetropia, bilateral: Secondary | ICD-10-CM | POA: Diagnosis not present

## 2020-10-05 ENCOUNTER — Encounter: Payer: 59 | Admitting: Family Medicine

## 2020-10-05 ENCOUNTER — Encounter: Payer: 59 | Admitting: Nurse Practitioner

## 2020-10-08 LAB — LIPID PANEL
Cholesterol: 241 — AB (ref 0–200)
HDL: 56 (ref 35–70)
LDL Cholesterol: 157
Triglycerides: 153 (ref 40–160)

## 2020-10-08 LAB — COMPREHENSIVE METABOLIC PANEL: Calcium: 9.8 (ref 8.7–10.7)

## 2020-10-08 LAB — CBC AND DIFFERENTIAL
HCT: 42 (ref 36–46)
Hemoglobin: 14 (ref 12.0–16.0)
Platelets: 459 — AB (ref 150–399)
WBC: 7.2

## 2020-10-08 LAB — HEMOGLOBIN A1C: Hemoglobin A1C: 5.9

## 2020-10-08 LAB — VITAMIN D 25 HYDROXY (VIT D DEFICIENCY, FRACTURES): Vit D, 25-Hydroxy: 30

## 2020-10-08 LAB — BASIC METABOLIC PANEL: Creatinine: 0.7 (ref <=1.1)

## 2020-10-08 LAB — HEPATIC FUNCTION PANEL
ALT: 10 (ref 7–35)
AST: 19 (ref 13–35)
Alkaline Phosphatase: 99 (ref 25–125)

## 2020-10-08 LAB — BASIC METABOLIC PANEL WITH GFR: Glucose: 87

## 2020-10-09 ENCOUNTER — Encounter: Payer: 59 | Admitting: Nurse Practitioner

## 2020-11-06 ENCOUNTER — Other Ambulatory Visit: Payer: Self-pay

## 2020-11-06 ENCOUNTER — Encounter: Payer: 59 | Admitting: Nurse Practitioner

## 2020-11-06 ENCOUNTER — Ambulatory Visit
Admission: RE | Admit: 2020-11-06 | Discharge: 2020-11-06 | Disposition: A | Discharge status: Home / Self Care | Payer: 59 | Source: Ambulatory Visit | Attending: Nurse Practitioner | Admitting: Nurse Practitioner

## 2020-11-06 DIAGNOSIS — Z1231 Encounter for screening mammogram for malignant neoplasm of breast: Secondary | ICD-10-CM

## 2020-11-16 ENCOUNTER — Ambulatory Visit (INDEPENDENT_AMBULATORY_CARE_PROVIDER_SITE_OTHER): Payer: 59 | Admitting: Unknown Physician Specialty

## 2020-11-16 ENCOUNTER — Other Ambulatory Visit: Payer: Self-pay

## 2020-11-16 ENCOUNTER — Encounter: Payer: Self-pay | Admitting: Unknown Physician Specialty

## 2020-11-16 VITALS — BP 135/85 | HR 57 | Temp 98.2°F | Ht 64.0 in | Wt 201.4 lb

## 2020-11-16 DIAGNOSIS — E669 Obesity, unspecified: Secondary | ICD-10-CM

## 2020-11-16 DIAGNOSIS — F322 Major depressive disorder, single episode, severe without psychotic features: Secondary | ICD-10-CM | POA: Diagnosis not present

## 2020-11-16 DIAGNOSIS — Z0000 Encounter for general adult medical examination without abnormal findings: Secondary | ICD-10-CM | POA: Diagnosis not present

## 2020-11-16 DIAGNOSIS — R7303 Prediabetes: Secondary | ICD-10-CM | POA: Insufficient documentation

## 2020-11-16 DIAGNOSIS — Z1159 Encounter for screening for other viral diseases: Secondary | ICD-10-CM

## 2020-11-16 DIAGNOSIS — R69 Illness, unspecified: Secondary | ICD-10-CM | POA: Diagnosis not present

## 2020-11-16 DIAGNOSIS — E7800 Pure hypercholesterolemia, unspecified: Secondary | ICD-10-CM

## 2020-11-16 DIAGNOSIS — E030 Congenital hypothyroidism with diffuse goiter: Secondary | ICD-10-CM | POA: Diagnosis not present

## 2020-11-16 MED ORDER — LEVOTHYROXINE SODIUM 75 MCG PO TABS: 75.0000 ug | ORAL_TABLET | Freq: Every day | ORAL | 1 refills | Status: DC

## 2020-11-16 MED ORDER — CITALOPRAM HYDROBROMIDE 40 MG PO TABS
40.0000 mg | ORAL_TABLET | Freq: Every day | ORAL | 1 refills | Status: DC
Start: 2020-11-16 — End: 2021-05-02

## 2020-11-16 NOTE — Progress Notes (Signed)
BP 135/85   Pulse (!) 57   Temp 98.2 F (36.8 C)   Ht 5\' 4"  (1.626 m)   Wt 201 lb 6.4 oz (91.4 kg)   SpO2 96%   BMI 34.57 kg/m    Subjective:    Patient ID: Amy Gilbert, female    DOB: 05/09/1966, 54 y.o.   MRN: [id number]  HPI: Amy Gilbert is a 54 y.o. female  Chief Complaint  Patient presents with  . Annual Exam   Thinks Citalopram needs to be increased.  She is feeling down and blue over the holidays due to multiple family stressors.   Depression screen [facility] 2/9 11/16/2020 03/23/2020 09/09/2019 07/20/2018 02/06/2017  Decreased Interest 0 0 0 0 0  Down, Depressed, Hopeless 3 0 0 0 0  PHQ - 2 Score 3 0 0 0 0  Altered sleeping 3 1 0 2 -  Tired, decreased energy 3 2 0 2 -  Change in appetite 0 2 0 2 -  Feeling bad or failure about yourself  0 0 0 0 -  Trouble concentrating 0 0 0 0 -  Moving slowly or fidgety/restless 0 0 0 0 -  Suicidal thoughts 0 0 0 0 -  PHQ-9 Score 9 5 0 6 -  Difficult doing work/chores Not difficult at all - Not difficult at all - -   Brought in labs from [facility].  LDL was 157.  Hgb A1C is 5.9% but did lose 20 pounds by walking and watching what she eats.    Relevant past medical, surgical, family and social history reviewed and updated as indicated. Interim medical history since our last visit reviewed. Allergies and medications reviewed and updated.  Review of Systems  Per HPI unless specifically indicated above     Objective:    BP 135/85   Pulse (!) 57   Temp 98.2 F (36.8 C)   Ht 5\' 4"  (1.626 m)   Wt 201 lb 6.4 oz (91.4 kg)   SpO2 96%   BMI 34.57 kg/m   Wt Readings from Last 3 Encounters:  11/16/20 201 lb 6.4 oz (91.4 kg)  03/23/20 224 lb (101.6 kg)  09/09/19 220 lb 6.4 oz (100 kg)    Physical Exam Constitutional:      General: She is not in acute distress.    Appearance: Normal appearance. She is well-developed and well-nourished.  HENT:     Head: Normocephalic and atraumatic.  Eyes:     General: Lids are  normal. No scleral icterus.       Right eye: No discharge.        Left eye: No discharge.     Conjunctiva/sclera: Conjunctivae normal.  Neck:     Vascular: No carotid bruit or JVD.  Cardiovascular:     Rate and Rhythm: Normal rate and regular rhythm.     Heart sounds: Normal heart sounds.  Pulmonary:     Effort: Pulmonary effort is normal.     Breath sounds: Normal breath sounds.  Chest:  Breasts:     Right: Normal.     Left: Normal.    Abdominal:     Palpations: There is no hepatomegaly or splenomegaly.  Musculoskeletal:        General: Normal range of motion.     Cervical back: Normal range of motion and neck supple.  Skin:    General: Skin is warm, dry and intact.     Coloration: Skin is not pale.     Findings:  No rash.  Neurological:     Mental Status: She is alert and oriented to person, place, and time.  Psychiatric:        Mood and Affect: Mood and affect normal.        Behavior: Behavior normal.        Thought Content: Thought content normal.        Judgment: Judgment normal.      Assessment & Plan:   Problem List Items Addressed This Visit      Unprioritized   Hyperlipidemia    ASCVD risk score 2.3%.  Continue lifestyle changes      Hypothyroidism    TSH 1.81.  Stable.  Continue present medication      Relevant Medications   levothyroxine (SYNTHROID) 75 MCG tablet   Obesity (BMI 35.0-39.9 without comorbidity)    Significant weight loss      Pre-diabetes    Last Hgb A1C is 5.9.  But losing weight and I expect if to improve      Severe depression (HCC)    Increase in depression.  See PHQ 9.  Increase Citalopram to 40 mg.  Recheck in 6 weeks      Relevant Medications   citalopram (CELEXA) 40 MG tablet    Other Visit Diagnoses    Need for hepatitis C screening test    -  Primary   Relevant Orders   Hepatitis C Antibody   Routine general medical examination at a health care facility          Vaccines:  Discussed Zostavax.  Will check with  pharmacy Due for Covid booster which she is planning  Follow up plan: Return in about 6 weeks (around 12/28/2020). for Citalopram effectiveness.

## 2020-11-16 NOTE — Assessment & Plan Note (Signed)
ASCVD risk score 2.3%.  Continue lifestyle changes

## 2020-11-16 NOTE — Assessment & Plan Note (Signed)
Last Hgb A1C is 5.9.  But losing weight and I expect if to improve

## 2020-11-16 NOTE — Assessment & Plan Note (Signed)
TSH 1.81.  Stable.  Continue present medication

## 2020-11-16 NOTE — Assessment & Plan Note (Signed)
Significant weight loss

## 2020-11-16 NOTE — Assessment & Plan Note (Signed)
Increase in depression.  See PHQ 9.  Increase Citalopram to 40 mg.  Recheck in 6 weeks

## 2020-11-17 LAB — HEPATITIS C ANTIBODY: Hep C Virus Ab: <0.1 s/co ratio (ref 0.0–0.9)

## 2020-11-26 ENCOUNTER — Other Ambulatory Visit: Payer: Self-pay | Admitting: Family Medicine

## 2020-12-28 ENCOUNTER — Telehealth (INDEPENDENT_AMBULATORY_CARE_PROVIDER_SITE_OTHER): Payer: 59 | Admitting: Family Medicine

## 2020-12-28 ENCOUNTER — Encounter: Payer: Self-pay | Admitting: Family Medicine

## 2020-12-28 DIAGNOSIS — F322 Major depressive disorder, single episode, severe without psychotic features: Secondary | ICD-10-CM

## 2020-12-28 DIAGNOSIS — R69 Illness, unspecified: Secondary | ICD-10-CM | POA: Diagnosis not present

## 2020-12-28 NOTE — Assessment & Plan Note (Signed)
Under good control on current regimen. Continue current regimen. Continue to monitor. Call with any concerns. Refills up to date. Follow up 5 months.

## 2020-12-28 NOTE — Progress Notes (Signed)
There were no vitals taken for this visit.   Subjective:    Patient ID: Amy Gilbert, female    DOB: 11-12-1966, 55 y.o.   MRN: [id number]  HPI: Amy Gilbert is a 55 y.o. female  Chief Complaint  Patient presents with  . Depression   DEPRESSION Mood status: better Satisfied with current treatment?: yes Symptom severity: mild  Duration of current treatment : chronic Side effects: no Medication compliance: excellent compliance Psychotherapy/counseling: no  Previous psychiatric medications: celexa Depressed mood: no Anxious mood: no Anhedonia: no Significant weight loss or gain: no Insomnia: no  Fatigue: no Feelings of worthlessness or guilt: no Impaired concentration/indecisiveness: no Suicidal ideations: no Hopelessness: no Crying spells: no Depression screen [facility]: Lambert Rhonda W 2/9 12/28/2020 11/16/2020 03/23/2020 09/09/2019 07/20/2018  Decreased Interest 0 0 0 0 0  Down, Depressed, Hopeless 0 3 0 0 0  PHQ - 2 Score 0 3 0 0 0  Altered sleeping 0 3 1 0 2  Tired, decreased energy 0 3 2 0 2  Change in appetite 0 0 2 0 2  Feeling bad or failure about yourself  0 0 0 0 0  Trouble concentrating 0 0 0 0 0  Moving slowly or fidgety/restless 0 0 0 0 0  Suicidal thoughts 0 0 0 0 0  PHQ-9 Score 0 9 5 0 6  Difficult doing work/chores Not difficult at all Not difficult at all - Not difficult at all -    Relevant past medical, surgical, family and social history reviewed and updated as indicated. Interim medical history since our last visit reviewed. Allergies and medications reviewed and updated.  Review of Systems  Constitutional: Negative.   Respiratory: Negative.   Cardiovascular: Negative.   Gastrointestinal: Negative.   Psychiatric/Behavioral: Negative.     Per HPI unless specifically indicated above     Objective:    There were no vitals taken for this visit.  Wt Readings from Last 3 Encounters:  11/16/20 201 lb 6.4 oz (91.4 kg)  03/23/20 224 lb (101.6 kg)   09/09/19 220 lb 6.4 oz (100 kg)    Physical Exam Vitals and nursing note reviewed.  Constitutional:      General: She is not in acute distress.    Appearance: Normal appearance. She is not ill-appearing, toxic-appearing or diaphoretic.  HENT:     Head: Normocephalic and atraumatic.     Right Ear: External ear normal.     Left Ear: External ear normal.     Nose: Nose normal.     Mouth/Throat:     Mouth: Mucous membranes are moist.     Pharynx: Oropharynx is clear.  Eyes:     General: No scleral icterus.       Right eye: No discharge.        Left eye: No discharge.     Conjunctiva/sclera: Conjunctivae normal.     Pupils: Pupils are equal, round, and reactive to light.  Pulmonary:     Effort: Pulmonary effort is normal. No respiratory distress.     Comments: Speaking in full sentences Musculoskeletal:        General: Normal range of motion.     Cervical back: Normal range of motion.  Skin:    Coloration: Skin is not jaundiced or pale.     Findings: No bruising, erythema, lesion or rash.  Neurological:     Mental Status: She is alert and oriented to person, place, and time. Mental status is at baseline.  Psychiatric:  Mood and Affect: Mood normal.        Behavior: Behavior normal.        Thought Content: Thought content normal.        Judgment: Judgment normal.     Results for orders placed or performed in visit on 38/25/05  Basic metabolic panel  Result Value Ref Range   Glucose 87   CBC and differential  Result Value Ref Range   Hemoglobin 14.0 12.0 - 16.0   HCT 42 36 - 46   Platelets 459 (A) 150 - 399   WBC 7.2   VITAMIN D 25 Hydroxy (Vit-D Deficiency, Fractures)  Result Value Ref Range   Vit D, 25-Hydroxy 30   Basic metabolic panel  Result Value Ref Range   Creatinine 0.7 0.5 - 1.1  Comprehensive metabolic panel  Result Value Ref Range   Calcium 9.8 8.7 - 10.7  Lipid panel  Result Value Ref Range   Triglycerides 153 40 - 160   Cholesterol 241 (A)  0 - 200   HDL 56 35 - 70   LDL Cholesterol 157   Hepatic function panel  Result Value Ref Range   Alkaline Phosphatase 99 25 - 125   ALT 10 7 - 35   AST 19 13 - 35  Hemoglobin A1c  Result Value Ref Range   Hemoglobin A1C 5.9       Assessment & Plan:   Problem List Items Addressed This Visit      Other   Severe depression ([place])    Under good control on current regimen. Continue current regimen. Continue to monitor. Call with any concerns. Refills up to date. Follow up 5 months.            Follow up plan: Return in about 5 months (around 05/28/2021).   . This visit was completed via MyChart due to the restrictions of the COVID-19 pandemic. All issues as above were discussed and addressed. Physical exam was done as above through visual confirmation on MyChart. If it was felt that the patient should be evaluated in the office, they were directed there. The patient verbally consented to this visit. . Location of the patient: home . Location of the provider: work . Those involved with this call:  . Provider: Park Liter, DO . CMA: Tiffany Reel, CMA . Front Desk/Registration: Jill Side  . Time spent on call: 15 minutes with patient face to face via video conference. More than 50% of this time was spent in counseling and coordination of care. 23 minutes total spent in review of patient's record and preparation of their chart.

## 2021-01-02 ENCOUNTER — Telehealth: Payer: Self-pay

## 2021-01-02 NOTE — Telephone Encounter (Signed)
lvm to make this apt.  

## 2021-01-02 NOTE — Telephone Encounter (Signed)
-----   Message from Valerie Roys, DO sent at 12/28/2020 10:43 AM EST ----- 5 month follow up with new PCP

## 2021-01-03 NOTE — Telephone Encounter (Signed)
Lvm to make apt.  

## 2021-01-04 ENCOUNTER — Encounter: Payer: Self-pay | Admitting: Family Medicine

## 2021-01-04 NOTE — Telephone Encounter (Signed)
Lvm to make apt. Sent letter.  °

## 2021-05-02 ENCOUNTER — Other Ambulatory Visit: Payer: Self-pay | Admitting: Unknown Physician Specialty

## 2021-05-10 ENCOUNTER — Ambulatory Visit: Payer: 59 | Admitting: Nurse Practitioner

## 2021-05-10 ENCOUNTER — Other Ambulatory Visit: Payer: Self-pay

## 2021-05-10 ENCOUNTER — Encounter: Payer: Self-pay | Admitting: Nurse Practitioner

## 2021-05-10 VITALS — BP 115/76 | HR 66 | Temp 98.7°F | Ht 64.17 in | Wt 212.2 lb

## 2021-05-10 DIAGNOSIS — R7303 Prediabetes: Secondary | ICD-10-CM | POA: Diagnosis not present

## 2021-05-10 DIAGNOSIS — E7800 Pure hypercholesterolemia, unspecified: Secondary | ICD-10-CM

## 2021-05-10 DIAGNOSIS — F322 Major depressive disorder, single episode, severe without psychotic features: Secondary | ICD-10-CM

## 2021-05-10 DIAGNOSIS — E030 Congenital hypothyroidism with diffuse goiter: Secondary | ICD-10-CM

## 2021-05-10 DIAGNOSIS — Z6836 Body mass index (BMI) 36.0-36.9, adult: Secondary | ICD-10-CM

## 2021-05-10 DIAGNOSIS — R69 Illness, unspecified: Secondary | ICD-10-CM | POA: Diagnosis not present

## 2021-05-10 DIAGNOSIS — E66812 Obesity, class 2: Secondary | ICD-10-CM

## 2021-05-10 DIAGNOSIS — E6609 Other obesity due to excess calories: Secondary | ICD-10-CM | POA: Diagnosis not present

## 2021-05-10 MED ORDER — LEVOTHYROXINE SODIUM 75 MCG PO TABS: 75.0000 ug | ORAL_TABLET | Freq: Every day | ORAL | 4 refills | Status: DC

## 2021-05-10 MED ORDER — CITALOPRAM HYDROBROMIDE 40 MG PO TABS: 40.0000 mg | ORAL_TABLET | Freq: Every day | ORAL | 4 refills | Status: DC

## 2021-05-10 NOTE — Progress Notes (Signed)
BP 115/76   Pulse 66   Temp 98.7 F (37.1 C) (Oral)   Ht 5' 4.17" (1.63 m)   Wt 212 lb 3.2 oz (96.3 kg)   SpO2 95%   BMI 36.23 kg/m    Subjective:    Patient ID: Amy Gilbert, female    DOB: Apr 17, 1966, 55 y.o.   MRN: [id number]  HPI: STEPHIE Gilbert is a 55 y.o. female  Chief Complaint  Patient presents with   Depression    Patient states that medication increase is working great, she is glad of the increase   DEPRESSION Celexa increased to 40 MG in January, feels improved mood with this.   Mood status: controlled Satisfied with current treatment?: yes Symptom severity: mild  Duration of current treatment : chronic Side effects: no Medication compliance: good compliance Psychotherapy/counseling: none Previous psychiatric medications: Celexa Depressed mood: no Anxious mood: no Anhedonia: no Significant weight loss or gain: no Insomnia: none Fatigue: no Feelings of worthlessness or guilt: no Impaired concentration/indecisiveness: no Suicidal ideations: no Hopelessness: no Crying spells: no Depression screen [facility] 2/9 05/10/2021 12/28/2020 11/16/2020 03/23/2020 09/09/2019  Decreased Interest 0 0 0 0 0  Down, Depressed, Hopeless 0 0 3 0 0  PHQ - 2 Score 0 0 3 0 0  Altered sleeping 0 0 3 1 0  Tired, decreased energy 0 0 3 2 0  Change in appetite 0 0 0 2 0  Feeling bad or failure about yourself  0 0 0 0 0  Trouble concentrating 0 0 0 0 0  Moving slowly or fidgety/restless 0 0 0 0 0  Suicidal thoughts 0 0 0 0 0  PHQ-9 Score 0 0 9 5 0  Difficult doing work/chores - Not difficult at all Not difficult at all - Not difficult at all    HYPOTHYROIDISM Continues on Levothyroxine 75 MCG.  Recent A1c in November was 5.9%. Thyroid control status:controlled Satisfied with current treatment? yes Medication side effects: no Medication compliance: good compliance Etiology of hypothyroidism: none Recent dose adjustment:no Fatigue: no Cold intolerance: no Heat  intolerance: no Weight gain: no Weight loss: no Constipation: no Diarrhea/loose stools: no Palpitations: no Lower extremity edema: no Anxiety/depressed mood: no  The 10-year ASCVD risk score Mikey Bussing DC Jr., et al., 2013) is: 1.5%*   Values used to calculate the score:     Age: 77 years     Sex: Female     Is Non-Hispanic African American: No     Diabetic: No     Tobacco smoker: No     Systolic Blood Pressure: 762 mmHg     Is BP treated: No     HDL Cholesterol: 56 mg/dL*     Total Cholesterol: 241 mg/dL*     * - Cholesterol units were assumed for this score calculation   Relevant past medical, surgical, family and social history reviewed and updated as indicated. Interim medical history since our last visit reviewed. Allergies and medications reviewed and updated.  Review of Systems  Constitutional:  Negative for activity change, appetite change, diaphoresis, fatigue and fever.  Respiratory:  Negative for cough, chest tightness and shortness of breath.   Cardiovascular:  Negative for chest pain, palpitations and leg swelling.  Gastrointestinal: Negative.   Endocrine: Negative for cold intolerance, heat intolerance, polydipsia, polyphagia and polyuria.  Neurological: Negative.   Psychiatric/Behavioral: Negative.     Per HPI unless specifically indicated above     Objective:    BP 115/76   Pulse 66  Temp 98.7 F (37.1 C) (Oral)   Ht 5' 4.17" (1.63 m)   Wt 212 lb 3.2 oz (96.3 kg)   SpO2 95%   BMI 36.23 kg/m   Wt Readings from Last 3 Encounters:  05/10/21 212 lb 3.2 oz (96.3 kg)  11/16/20 201 lb 6.4 oz (91.4 kg)  03/23/20 224 lb (101.6 kg)    Physical Exam Vitals and nursing note reviewed.  Constitutional:      General: She is awake. She is not in acute distress.    Appearance: She is well-developed and well-groomed. She is obese. She is not ill-appearing or toxic-appearing.  HENT:     Head: Normocephalic.     Right Ear: Hearing normal.     Left Ear: Hearing  normal.  Eyes:     General: Lids are normal.        Right eye: No discharge.        Left eye: No discharge.     Conjunctiva/sclera: Conjunctivae normal.     Pupils: Pupils are equal, round, and reactive to light.  Neck:     Thyroid: No thyromegaly.     Vascular: No carotid bruit or JVD.  Cardiovascular:     Rate and Rhythm: Normal rate and regular rhythm.     Heart sounds: Normal heart sounds. No murmur heard.   No gallop.  Pulmonary:     Effort: Pulmonary effort is normal. No accessory muscle usage or respiratory distress.     Breath sounds: Normal breath sounds.  Abdominal:     General: Bowel sounds are normal.     Palpations: Abdomen is soft. There is no hepatomegaly or splenomegaly.  Musculoskeletal:     Cervical back: Normal range of motion and neck supple.     Right lower leg: No edema.     Left lower leg: No edema.  Lymphadenopathy:     Cervical: No cervical adenopathy.  Skin:    General: Skin is warm and dry.  Neurological:     Mental Status: She is alert and oriented to person, place, and time.  Psychiatric:        Attention and Perception: Attention normal.        Mood and Affect: Mood normal.        Speech: Speech normal.        Behavior: Behavior normal. Behavior is cooperative.        Thought Content: Thought content normal.    Results for orders placed or performed in visit on 76/16/07  Basic metabolic panel  Result Value Ref Range   Glucose 87   CBC and differential  Result Value Ref Range   Hemoglobin 14.0 12.0 - 16.0   HCT 42 36 - 46   Platelets 459 (A) 150 - 399   WBC 7.2   VITAMIN D 25 Hydroxy (Vit-D Deficiency, Fractures)  Result Value Ref Range   Vit D, 25-Hydroxy 30   Basic metabolic panel  Result Value Ref Range   Creatinine 0.7 0.5 - 1.1  Comprehensive metabolic panel  Result Value Ref Range   Calcium 9.8 8.7 - 10.7  Lipid panel  Result Value Ref Range   Triglycerides 153 40 - 160   Cholesterol 241 (A) 0 - 200   HDL 56 35 - 70    LDL Cholesterol 157   Hepatic function panel  Result Value Ref Range   Alkaline Phosphatase 99 25 - 125   ALT 10 7 - 35   AST 19 13 - 35  Hemoglobin A1c  Result Value Ref Range   Hemoglobin A1C 5.9       Assessment & Plan:   Problem List Items Addressed This Visit       Endocrine   Hypothyroidism    Chronic, stable with current Levothyroxine dosing. 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  Refills sent in.       Relevant Medications   levothyroxine (SYNTHROID) 75 MCG tablet   Other Relevant Orders   T4, free   TSH     Other   Severe depression ([facility]) - Primary    Chronic, improved with increase Celexa.  Denies SI/HI.  Continue current medication regimen and adjust as needed.  Refills sent in.  Return in 6 months for physical.       Relevant Medications   citalopram (CELEXA) 40 MG tablet   Hyperlipidemia    Ongoing with ASCVD 1.5%, will obtain lipid panel in December at physical.       Obesity    BMI 36.23.  Recommended eating smaller high protein, low fat meals more frequently and exercising 30 mins a day 5 times a week with a goal of 10-15lb weight loss in the next 3 months. Patient voiced their understanding and motivation to adhere to these recommendations.        Pre-diabetes    Last A1c November 2021 = 5.9%.  Continue diet focus and recheck via [place] [facility] in November.         Follow up plan: Return in about 6 months (around 11/18/2021) for Annual physical.

## 2021-05-10 NOTE — Patient Instructions (Signed)
http://APA.org/depression-guideline"> https://clinicalkey.com"> http://point-of-care.elsevierperformancemanager.com/skills/"> http://point-of-care.elsevierperformancemanager.com">  Managing Depression, Adult Depression is a mental health condition that affects your thoughts, feelings, and actions. Being diagnosed with depression can bring you relief if you did not know why you have felt or behaved a certain way. It could also leave you feeling overwhelmed with uncertainty about your future. Preparing yourself tomanage your symptoms can help you feel more positive about your future. How to manage lifestyle changes Managing stress  Stress is your body's reaction to life changes and events, both good and bad. Stress can add to your feelings of depression. Learning to manage your stresscan help lessen your feelings of depression. Try some of the following approaches to reducing your stress (stress reduction techniques): Listen to music that you enjoy and that inspires you. Try using a meditation app or take a meditation class. Develop a practice that helps you connect with your spiritual self. Walk in nature, pray, or go to a place of worship. Do some deep breathing. To do this, inhale slowly through your nose. Pause at the top of your inhale for a few seconds and then exhale slowly, letting your muscles relax. Practice yoga to help relax and work your muscles. Choose a stress reduction technique that suits your lifestyle and personality. These techniques take time and practice to develop. Set aside 5-15 minutes a day to do them. Therapists can offer training in these techniques. Other things you can do to manage stress include: Keeping a stress diary. Knowing your limits and saying no when you think something is too much. Paying attention to how you react to certain situations. You may not be able to control everything, but you can change your reaction. Adding humor to your life by watching funny films  or TV shows. Making time for activities that you enjoy and that relax you.  Medicines Medicines, such as antidepressants, are often a part of treatment for depression. Talk with your pharmacist or health care provider about all the medicines, supplements, and herbal products that you take, their possible side effects, and what medicines and other products are safe to take together. Make sure to report any side effects you may have to your health care provider. Relationships Your health care provider may suggest family therapy, couples therapy, orindividual therapy as part of your treatment. How to recognize changes Everyone responds differently to treatment for depression. As you recover from depression, you may start to: Have more interest in doing activities. Feel less hopeless. Have more energy. Overeat less often, or have a better appetite. Have better mental focus. It is important to recognize if your depression is not getting better or is getting worse. The symptoms you had in the beginning may return, such as: Tiredness (fatigue) or low energy. Eating too much or too little. Sleeping too much or too little. Feeling restless, agitated, or hopeless. Trouble focusing or making decisions. Unexplained physical complaints. Feeling irritable, angry, or aggressive. If you or your family members notice these symptoms coming back, let yourhealth care provider know right away. Follow these instructions at home: Activity  Try to get some form of exercise each day, such as walking, biking, swimming, or lifting weights. Practice stress reduction techniques. Engage your mind by taking a class or doing some volunteer work.  Lifestyle Get the right amount and quality of sleep. Cut down on using caffeine, tobacco, alcohol, and other potentially harmful substances. Eat a healthy diet that includes plenty of vegetables, fruits, whole grains, low-fat dairy products, and lean protein. Do not   eat  a lot of foods that are high in solid fats, added sugars, or salt (sodium). General instructions Take over-the-counter and prescription medicines only as told by your health care provider. Keep all follow-up visits as told by your health care provider. This is important. Where to find support Talking to others  Friends and family members can be sources of support and guidance. Talk to trusted friends or family members about your condition. Explain your symptoms to them, and let them know that you are working with a health care provider to treat your depression. Tell friends and family members how they also can behelpful. Finances Find appropriate mental health providers that fit with your financial situation. Talk with your health care provider about options to get reduced prices on your medicines. Where to find more information You can find support in your area from: Anxiety and Depression Association of America (ADAA): www.adaa.org Mental Health America: www.mentalhealthamerica.net National Alliance on Mental Illness: www.nami.org Contact a health care provider if: You stop taking your antidepressant medicines, and you have any of these symptoms: Nausea. Headache. Light-headedness. Chills and body aches. Not being able to sleep (insomnia). You or your friends and family think your depression is getting worse. Get help right away if: You have thoughts of hurting yourself or others. If you ever feel like you may hurt yourself or others, or have thoughts about taking your own life, get help right away. Go to your nearest emergency department or: Call your local emergency services (911 in the [place] Call a suicide crisis helpline, such as the National Suicide Prevention Lifeline at 1-800-273-8255. This is open 24 hours a day in the [place] Text the Crisis Text Line at 741741 (in the [place] Summary If you are diagnosed with depression, preparing yourself to manage your symptoms is a good way  to feel positive about your future. Work with your health care provider on a management plan that includes stress reduction techniques, medicines (if applicable), therapy, and healthy lifestyle habits. Keep talking with your health care provider about how your treatment is working. If you have thoughts about taking your own life, call a suicide crisis helpline or text a crisis text line. This information is not intended to replace advice given to you by your health care provider. Make sure you discuss any questions you have with your healthcare provider. Document Revised: 09/28/2019 Document Reviewed: 09/28/2019 Elsevier Patient Education  2022 Elsevier Inc.  

## 2021-05-10 NOTE — Assessment & Plan Note (Signed)
BMI 36.23.  Recommended eating smaller high protein, low fat meals more frequently and exercising 30 mins a day 5 times a week with a goal of 10-15lb weight loss in the next 3 months. Patient voiced their understanding and motivation to adhere to these recommendations.

## 2021-05-10 NOTE — Assessment & Plan Note (Signed)
Chronic, stable with current Levothyroxine dosing. 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  Refills sent in.

## 2021-05-10 NOTE — Assessment & Plan Note (Signed)
Chronic, improved with increase Celexa.  Denies SI/HI.  Continue current medication regimen and adjust as needed.  Refills sent in.  Return in 6 months for physical.

## 2021-05-10 NOTE — Assessment & Plan Note (Signed)
Ongoing with ASCVD 1.5%, will obtain lipid panel in December at physical.

## 2021-05-10 NOTE — Assessment & Plan Note (Signed)
Last A1c November 2021 = 5.9%.  Continue diet focus and recheck via [facility] in November.

## 2021-11-04 ENCOUNTER — Encounter: Payer: Self-pay | Admitting: Nurse Practitioner

## 2021-11-04 ENCOUNTER — Telehealth: Payer: Self-pay | Admitting: Nurse Practitioner

## 2021-11-04 NOTE — Telephone Encounter (Signed)
Pt would like Jolene to be on the look out for a fax from her.  She had labs done at her work, and her thyroid level has gone from one extreme to the other er pt.

## 2021-11-05 NOTE — Telephone Encounter (Signed)
Reviewed and will contact patient.

## 2021-11-05 NOTE — Telephone Encounter (Signed)
Fax has not been received.  Called patient, she will re-fax documents.

## 2021-11-05 NOTE — Telephone Encounter (Signed)
Paperwork has been received through fax, placed in provider's folder for review.

## 2021-11-12 NOTE — Telephone Encounter (Signed)
Copied from [place] [id number]. Topic: General - Other >> Nov 12, 2021  8:25 AM Leward Quan A wrote: Reason for CRM: Patient called in to inquire of Jolene Cannady about her blood work and where she want to go with the things that were out of place with the labs sent over last week. Patient would like a call at Ph#  [phone]

## 2021-11-23 DIAGNOSIS — L821 Other seborrheic keratosis: Secondary | ICD-10-CM | POA: Insufficient documentation

## 2021-11-26 ENCOUNTER — Ambulatory Visit (INDEPENDENT_AMBULATORY_CARE_PROVIDER_SITE_OTHER): Payer: 59 | Admitting: Nurse Practitioner

## 2021-11-26 ENCOUNTER — Encounter: Payer: Self-pay | Admitting: Nurse Practitioner

## 2021-11-26 ENCOUNTER — Other Ambulatory Visit: Payer: Self-pay

## 2021-11-26 VITALS — BP 120/72 | HR 82 | Temp 97.7°F | Ht 65.0 in | Wt 218.6 lb

## 2021-11-26 DIAGNOSIS — R69 Illness, unspecified: Secondary | ICD-10-CM | POA: Diagnosis not present

## 2021-11-26 DIAGNOSIS — R7303 Prediabetes: Secondary | ICD-10-CM | POA: Diagnosis not present

## 2021-11-26 DIAGNOSIS — E6609 Other obesity due to excess calories: Secondary | ICD-10-CM

## 2021-11-26 DIAGNOSIS — E7800 Pure hypercholesterolemia, unspecified: Secondary | ICD-10-CM

## 2021-11-26 DIAGNOSIS — E030 Congenital hypothyroidism with diffuse goiter: Secondary | ICD-10-CM | POA: Diagnosis not present

## 2021-11-26 DIAGNOSIS — F322 Major depressive disorder, single episode, severe without psychotic features: Secondary | ICD-10-CM | POA: Diagnosis not present

## 2021-11-26 DIAGNOSIS — Z23 Encounter for immunization: Secondary | ICD-10-CM | POA: Diagnosis not present

## 2021-11-26 DIAGNOSIS — Z0000 Encounter for general adult medical examination without abnormal findings: Secondary | ICD-10-CM

## 2021-11-26 DIAGNOSIS — Z1231 Encounter for screening mammogram for malignant neoplasm of breast: Secondary | ICD-10-CM | POA: Diagnosis not present

## 2021-11-26 DIAGNOSIS — Z6836 Body mass index (BMI) 36.0-36.9, adult: Secondary | ICD-10-CM

## 2021-11-26 DIAGNOSIS — J3089 Other allergic rhinitis: Secondary | ICD-10-CM | POA: Diagnosis not present

## 2021-11-26 DIAGNOSIS — N6002 Solitary cyst of left breast: Secondary | ICD-10-CM

## 2021-11-26 DIAGNOSIS — E559 Vitamin D deficiency, unspecified: Secondary | ICD-10-CM

## 2021-11-26 MED ORDER — CITALOPRAM HYDROBROMIDE 40 MG PO TABS: 40.0000 mg | ORAL_TABLET | Freq: Every day | ORAL | 4 refills | Status: DC

## 2021-11-26 MED ORDER — SHINGRIX 50 MCG/0.5ML IM SUSR: 0.5000 mL | Freq: Once | INTRAMUSCULAR | 0 refills | Status: AC

## 2021-11-26 NOTE — Assessment & Plan Note (Signed)
Ongoing with ASCVD 1.8% and recent LDL 175, educated patient on diet changes and fish oil supplement.  She is aware of reasons for statin initiation.

## 2021-11-26 NOTE — Assessment & Plan Note (Signed)
Chronic, stable with current Levothyroxine dosing. 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  Refills sent in.

## 2021-11-26 NOTE — Assessment & Plan Note (Signed)
BMI 36.38.  Recommended eating smaller high protein, low fat meals more frequently and exercising 30 mins a day 5 times a week with a goal of 10-15lb weight loss in the next 3 months. Patient voiced their understanding and motivation to adhere to these recommendations.

## 2021-11-26 NOTE — Progress Notes (Signed)
BP 120/72    Pulse 82    Temp 97.7 F (36.5 C) (Oral)    Ht 5\' 5"  (1.651 m)    Wt 218 lb 9.6 oz (99.2 kg)    SpO2 98%    BMI 36.38 kg/m    Subjective:    Patient ID: Amy Gilbert, female    DOB: 04/02/1966, 55 y.o.   MRN: [id number]  HPI: Amy Gilbert is a 55 y.o. female presenting on 11/26/2021 for comprehensive medical examination. Current medical complaints include:none  She currently lives with: significant other Menopausal Symptoms: yes -- more irregular cycles with heavier bleeding -- last cycle was 3 months ago -- not too many hot flashes  Vitamin D level low on recent [facility] labs at 16 and LDL remains elevated at 175.  The 10-year ASCVD risk score (Arnett DK, et al., 2019) is: 1.8%*   Values used to calculate the score:     Age: 53 years     Sex: Female     Is Non-Hispanic African American: No     Diabetic: No     Tobacco smoker: No     Systolic Blood Pressure: 220 mmHg     Is BP treated: No     HDL Cholesterol: 56 mg/dL*     Total Cholesterol: 241 mg/dL*     * - Cholesterol units were assumed for this score calculation  HYPOTHYROIDISM Continues on Levothyroxine 75 MCG (has history of hyperthyroid with treatment which made her more hypo) with recent TSH via [facility] labs 4.99 and Free T4 1.1.  Recent A1c in November 2021 was 5.9%, recent on [facility] labs 5.5%. Thyroid control status:controlled Satisfied with current treatment? yes Medication side effects: no Medication compliance: good compliance Etiology of hypothyroidism: none Recent dose adjustment:no Fatigue: no Cold intolerance: no Heat intolerance: no Weight gain: no Weight loss: no Constipation: no Diarrhea/loose stools: no Palpitations: no Lower extremity edema: no Anxiety/depressed mood: no   DEPRESSION Taking Celexa 40 MG daily for this. Is in menopausal phase of life and endorses difficulty with sleep. Mood status: controlled Satisfied with current treatment?: yes Symptom severity: mild   Duration of current treatment : chronic Side effects: no Medication compliance: good compliance Psychotherapy/counseling: none Previous psychiatric medications: Celexa Depressed mood: no Anxious mood: no Anhedonia: no Significant weight loss or gain: no Insomnia: none Fatigue: yes Feelings of worthlessness or guilt: no Impaired concentration/indecisiveness: no Suicidal ideations: no Hopelessness: no Crying spells: no Depression screen [facility] 2/9 11/26/2021 05/10/2021 12/28/2020 11/16/2020 03/23/2020  Decreased Interest 0 0 0 0 0  Down, Depressed, Hopeless 0 0 0 3 0  PHQ - 2 Score 0 0 0 3 0  Altered sleeping 3 0 0 3 1  Tired, decreased energy 3 0 0 3 2  Change in appetite 0 0 0 0 2  Feeling bad or failure about yourself  0 0 0 0 0  Trouble concentrating 3 0 0 0 0  Moving slowly or fidgety/restless 0 0 0 0 0  Suicidal thoughts 0 0 0 0 0  PHQ-9 Score 9 0 0 9 5  Difficult doing work/chores - - Not difficult at all Not difficult at all -    Fall Risk 02/06/2017 09/09/2019 03/23/2020 05/10/2021 11/26/2021  Falls in the past year? Yes 0 0 1 0  Was there an injury with Fall? No 0 0 0 0  Fall Risk Category Calculator - 0 0 1 0  Fall Risk Category - Low Low Low Low  Patient  Fall Risk Level - Low fall risk Low fall risk - Low fall risk  Patient at Risk for Falls Due to - - - No Fall Risks No Fall Risks  Fall risk Follow up - Falls evaluation completed - Falls evaluation completed Falls evaluation completed    Functional Status Survey: Is the patient deaf or have difficulty hearing?: No Does the patient have difficulty seeing, even when wearing glasses/contacts?: No Does the patient have difficulty concentrating, remembering, or making decisions?: No Does the patient have difficulty walking or climbing stairs?: No Does the patient have difficulty dressing or bathing?: No Does the patient have difficulty doing errands alone such as visiting a doctor's office or shopping?: No    Past  Medical History:  Past Medical History:  Diagnosis Date   Anxiety    Depression    Lumbago    Malaise and fatigue    Sciatica    Thyroid disease     Surgical History:  Past Surgical History:  Procedure Laterality Date   COLONOSCOPY WITH PROPOFOL N/A 08/04/2018   Procedure: COLONOSCOPY WITH PROPOFOL;  Surgeon: Virgel Manifold, MD;  Location: [facility] ENDOSCOPY;  Service: Endoscopy;  Laterality: N/A;   DILATION AND CURETTAGE OF UTERUS     TUBAL LIGATION      Medications:  Current Outpatient Medications on File Prior to Visit  Medication Sig   Levocetirizine Dihydrochloride (XYZAL PO) Take by mouth daily.   levothyroxine (SYNTHROID) 75 MCG tablet Take 1 tablet (75 mcg total) by mouth daily.   triamcinolone (NASACORT) 55 MCG/ACT AERO nasal inhaler Place 2 sprays into the nose daily.   TURMERIC PO Take by mouth daily.   No current facility-administered medications on file prior to visit.    Allergies:  No Known Allergies  Social History:  Social History   Socioeconomic History   Marital status: Single    Spouse name: Not on file   Number of children: Not on file   Years of education: Not on file   Highest education level: Not on file  Occupational History   Not on file  Tobacco Use   Smoking status: Never   Smokeless tobacco: Never  Vaping Use   Vaping Use: Never used  Substance and Sexual Activity   Alcohol use: Yes    Alcohol/week: 0.0 standard drinks    Comment: rarely   Drug use: No   Sexual activity: Yes  Other Topics Concern   Not on file  Social History Narrative   Not on file   Social Determinants of Health   Financial Resource Strain: Not on file  Food Insecurity: Not on file  Transportation Needs: Not on file  Physical Activity: Not on file  Stress: Not on file  Social Connections: Not on file  Intimate Partner Violence: Not on file   Social History   Tobacco Use  Smoking Status Never  Smokeless Tobacco Never   Social History    Substance and Sexual Activity  Alcohol Use Yes   Alcohol/week: 0.0 standard drinks   Comment: rarely    Family History:  Family History  Problem Relation Age of Onset   Cancer Mother        liver age 53's   Lung disease Mother    Cancer Father        non hodgkins lymphoma   Hypertension Father    Diabetes Brother    Diabetes Maternal Grandmother    Cancer Maternal Grandfather        lung  Diabetes Paternal Grandmother     Past medical history, surgical history, medications, allergies, family history and social history reviewed with patient today and changes made to appropriate areas of the chart.   ROS All other ROS negative except what is listed above and in the HPI.      Objective:    BP 120/72    Pulse 82    Temp 97.7 F (36.5 C) (Oral)    Ht 5\' 5"  (1.651 m)    Wt 218 lb 9.6 oz (99.2 kg)    SpO2 98%    BMI 36.38 kg/m   Wt Readings from Last 3 Encounters:  11/26/21 218 lb 9.6 oz (99.2 kg)  05/10/21 212 lb 3.2 oz (96.3 kg)  11/16/20 201 lb 6.4 oz (91.4 kg)    Physical Exam Vitals and nursing note reviewed. Exam conducted with a chaperone present.  Constitutional:      General: She is awake. She is not in acute distress.    Appearance: She is well-developed and well-groomed. She is obese. She is not ill-appearing or toxic-appearing.  HENT:     Head: Normocephalic and atraumatic.     Right Ear: Hearing, tympanic membrane, ear canal and external ear normal. No drainage.     Left Ear: Hearing, tympanic membrane, ear canal and external ear normal. No drainage.     Nose: Nose normal.     Right Sinus: No maxillary sinus tenderness or frontal sinus tenderness.     Left Sinus: No maxillary sinus tenderness or frontal sinus tenderness.     Mouth/Throat:     Mouth: Mucous membranes are moist.     Pharynx: Oropharynx is clear. Uvula midline. No pharyngeal swelling, oropharyngeal exudate or posterior oropharyngeal erythema.  Eyes:     General: Lids are normal.         Right eye: No discharge.        Left eye: No discharge.     Extraocular Movements: Extraocular movements intact.     Conjunctiva/sclera: Conjunctivae normal.     Pupils: Pupils are equal, round, and reactive to light.     Visual Fields: Right eye visual fields normal and left eye visual fields normal.  Neck:     Thyroid: No thyromegaly.     Vascular: No carotid bruit.     Trachea: Trachea normal.  Cardiovascular:     Rate and Rhythm: Normal rate and regular rhythm.     Heart sounds: Normal heart sounds. No murmur heard.   No gallop.  Pulmonary:     Effort: Pulmonary effort is normal. No accessory muscle usage or respiratory distress.     Breath sounds: Normal breath sounds.  Chest:  Breasts:    Right: Normal.     Left: Mass present. No swelling, nipple discharge, skin change or tenderness.    Abdominal:     General: Bowel sounds are normal.     Palpations: Abdomen is soft. There is no hepatomegaly or splenomegaly.     Tenderness: There is no abdominal tenderness.  Musculoskeletal:        General: Normal range of motion.     Cervical back: Normal range of motion and neck supple.     Right lower leg: No edema.     Left lower leg: No edema.  Lymphadenopathy:     Head:     Right side of head: No submental, submandibular, tonsillar, preauricular or posterior auricular adenopathy.     Left side of head: No submental, submandibular, tonsillar, preauricular  or posterior auricular adenopathy.     Cervical: No cervical adenopathy.  Skin:    General: Skin is warm and dry.     Capillary Refill: Capillary refill takes less than 2 seconds.     Findings: No rash.  Neurological:     Mental Status: She is alert and oriented to person, place, and time.     Gait: Gait is intact.     Deep Tendon Reflexes: Reflexes are normal and symmetric.     Reflex Scores:      Brachioradialis reflexes are 2+ on the right side and 2+ on the left side.      Patellar reflexes are 2+ on the right side and  2+ on the left side. Psychiatric:        Attention and Perception: Attention normal.        Mood and Affect: Mood normal.        Speech: Speech normal.        Behavior: Behavior normal. Behavior is cooperative.        Thought Content: Thought content normal.        Judgment: Judgment normal.    Results for orders placed or performed in visit on 73/41/93  Basic metabolic panel  Result Value Ref Range   Glucose 87   CBC and differential  Result Value Ref Range   Hemoglobin 14.0 12.0 - 16.0   HCT 42 36 - 46   Platelets 459 (A) 150 - 399   WBC 7.2   VITAMIN D 25 Hydroxy (Vit-D Deficiency, Fractures)  Result Value Ref Range   Vit D, 25-Hydroxy 30   Basic metabolic panel  Result Value Ref Range   Creatinine 0.7 0.5 - 1.1  Comprehensive metabolic panel  Result Value Ref Range   Calcium 9.8 8.7 - 10.7  Lipid panel  Result Value Ref Range   Triglycerides 153 40 - 160   Cholesterol 241 (A) 0 - 200   HDL 56 35 - 70   LDL Cholesterol 157   Hepatic function panel  Result Value Ref Range   Alkaline Phosphatase 99 25 - 125   ALT 10 7 - 35   AST 19 13 - 35  Hemoglobin A1c  Result Value Ref Range   Hemoglobin A1C 5.9       Assessment & Plan:   Problem List Items Addressed This Visit       Respiratory   Allergic rhinitis    Chronic, ongoing -- continue OTC Xyzal and home regimen.  If any worsening symptoms consider allergist referral.        Endocrine   Hypothyroidism    Chronic, stable with current Levothyroxine dosing. 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  Refills sent in.      Relevant Orders   T4, free   TSH     Other   Breast cyst, left    Known and monitored via mammogram annually.      Hyperlipidemia    Ongoing with ASCVD 1.8% and recent LDL 175, educated patient on diet changes and fish oil supplement.  She is aware of reasons for statin initiation.      Obesity    BMI 36.38.  Recommended eating smaller high  protein, low fat meals more frequently and exercising 30 mins a day 5 times a week with a goal of 10-15lb weight loss in the next 3 months. Patient voiced their understanding and motivation to adhere to these  recommendations.       Pre-diabetes    Last A1c 5.5%.  Continue diet focus and recheck via [facility] labs annually.      Severe depression ([place]) - Primary    Chronic, stable with Celexa.  Denies SI/HI.  Continue current medication regimen and adjust as needed.  Refills sent in.  Return in 6 months for follow-up.      Relevant Medications   citalopram (CELEXA) 40 MG tablet   Vitamin D deficiency    Noted on recent [facility] labs -- level 16, have recommended she start Vitamin D3 2000 units daily and check annually on labs.      Other Visit Diagnoses     Encounter for screening mammogram for malignant neoplasm of breast       Mammogram ordered   Relevant Orders   MM 3D SCREEN BREAST BILATERAL   Need for shingles vaccine       Shingrix vaccines ordered to pharmacy.   Encounter for annual physical exam       Annual physical with labs.  Health maintenance reviewed with patient.        Follow up plan: Return in about 6 months (around 05/27/2022) for MOOD and THYROID.   LABORATORY TESTING:  - Pap smear: up to date  IMMUNIZATIONS:   - Tdap: Tetanus vaccination status reviewed: last tetanus booster within 10 years. - Influenza: Up to date - Pneumovax: Not applicable - Prevnar: Not applicable - COVID: Up to date - HPV: Not applicable - Shingrix vaccine:  ordered today  SCREENING: -Mammogram: Up to date  - Colonoscopy: Up to date  - Bone Density: Not applicable  -Hearing Test: Not applicable  -Spirometry: Not applicable   PATIENT COUNSELING:   Advised to take 1 mg of folate supplement per day if capable of pregnancy.   Sexuality: Discussed sexually transmitted diseases, partner selection, use of condoms, avoidance of unintended pregnancy  and contraceptive alternatives.    Advised to avoid cigarette smoking.  I discussed with the patient that most people either abstain from alcohol or drink within safe limits (<=14/week and <=4 drinks/occasion for males, <=7/weeks and <= 3 drinks/occasion for females) and that the risk for alcohol disorders and other health effects rises proportionally with the number of drinks per week and how often a drinker exceeds daily limits.  Discussed cessation/primary prevention of drug use and availability of treatment for abuse.   Diet: Encouraged to adjust caloric intake to maintain  or achieve ideal body weight, to reduce intake of dietary saturated fat and total fat, to limit sodium intake by avoiding high sodium foods and not adding table salt, and to maintain adequate dietary potassium and calcium preferably from fresh fruits, vegetables, and low-fat dairy products.    Stressed the importance of regular exercise  Injury prevention: Discussed safety belts, safety helmets, smoke detector, smoking near bedding or upholstery.   Dental health: Discussed importance of regular tooth brushing, flossing, and dental visits.    NEXT PREVENTATIVE PHYSICAL DUE IN 1 YEAR. Return in about 6 months (around 05/27/2022) for MOOD and THYROID.

## 2021-11-26 NOTE — Patient Instructions (Signed)
Please call to schedule your mammogram and/or bone density: °[facility]  °Address: [address], [place], [place] [address]  °Phone: [phone]  ° °Mammogram °A mammogram is an X-ray of the breasts. This is done to check for changes that are not normal. This test can look for changes that may be caused by breast cancer or other problems. °Mammograms are regularly done on women beginning at age 53. A man may have a mammogram if he has a lump or swelling in his breast. °Tell a doctor: °About any allergies you have. °If you have breast implants. °If you have had breast disease, biopsy, or surgery. °If you have a family history of breast cancer. °If you are breastfeeding. °Whether you are pregnant or may be pregnant. °What are the risks? °Generally, this is a safe procedure. But problems may occur, including: °Being exposed to radiation. Radiation levels are very low with this test. °The need for more tests. °The results were not read properly. °Trouble finding breast cancer in women with dense breasts. °What happens before the test? °Have this test done about 1-2 weeks after your menstrual period. This is often when your breasts are the least tender. °If you are visiting a new doctor or clinic, have any past mammogram images sent to your new doctor's office. °Wash your breasts and under your arms on the day of the test. °Do not use deodorants, perfumes, lotions, or powders on the day of the test. °Take off any jewelry from your neck. °Wear clothes that you can change into and out of easily. °What happens during the test? ° °You will take off your clothes from the waist up. You will put on a gown. °You will stand in front of the X-ray machine. °Each breast will be placed between two plastic or glass plates. The plates will press down on your breast for a few seconds. Try to relax. This does not cause any harm to your breasts. It may not feel comfortable, but it will be very  brief. °X-rays will be taken from different angles of each breast. °The procedure may vary among doctors and hospitals. °What can I expect after the test? °The mammogram will be read by a specialist (radiologist). °You may need to do parts of the test again. This depends on the quality of the images. °You may go back to your normal activities. °It is up to you to get the results of your test. Ask how to get your results when they are ready. °Summary °A mammogram is an X-ray of the breasts. It looks for changes that may be caused by breast cancer or other problems. °A man may have this test if he has a lump or swelling in his breast. °Before the test, tell your doctor about any breast problems that you have had in the past. °Have this test done about 1-2 weeks after your menstrual period. °Ask when your test results will be ready. Make sure you get your test results. °This information is not intended to replace advice given to you by your health care provider. Make sure you discuss any questions you have with your health care provider. °Document Revised: 07/31/2021 Document Reviewed: 09/17/2020 °Elsevier Patient Education © 2022 [employer] ° °

## 2021-11-26 NOTE — Assessment & Plan Note (Signed)
Known and monitored via mammogram annually.

## 2021-11-26 NOTE — Assessment & Plan Note (Signed)
Chronic, ongoing -- continue OTC Xyzal and home regimen.  If any worsening symptoms consider allergist referral.

## 2021-11-26 NOTE — Assessment & Plan Note (Signed)
Chronic, stable with Celexa.  Denies SI/HI.  Continue current medication regimen and adjust as needed.  Refills sent in.  Return in 6 months for follow-up.

## 2021-11-26 NOTE — Assessment & Plan Note (Signed)
Last A1c 5.5%.  Continue diet focus and recheck via [facility] labs annually.

## 2021-11-26 NOTE — Assessment & Plan Note (Signed)
Noted on recent [facility] labs -- level 16, have recommended she start Vitamin D3 2000 units daily and check annually on labs.

## 2022-05-23 ENCOUNTER — Encounter (HOSPITAL_BASED_OUTPATIENT_CLINIC_OR_DEPARTMENT_OTHER): Payer: Self-pay

## 2022-05-23 ENCOUNTER — Other Ambulatory Visit: Payer: Self-pay

## 2022-05-23 ENCOUNTER — Emergency Department (HOSPITAL_BASED_OUTPATIENT_CLINIC_OR_DEPARTMENT_OTHER)
Admission: EM | Admit: 2022-05-23 | Discharge: 2022-05-23 | Disposition: A | Discharge status: Home / Self Care | Payer: 59 | Attending: Emergency Medicine | Admitting: Emergency Medicine

## 2022-05-23 ENCOUNTER — Emergency Department (HOSPITAL_BASED_OUTPATIENT_CLINIC_OR_DEPARTMENT_OTHER): Payer: 59

## 2022-05-23 DIAGNOSIS — R110 Nausea: Secondary | ICD-10-CM | POA: Insufficient documentation

## 2022-05-23 DIAGNOSIS — R109 Unspecified abdominal pain: Secondary | ICD-10-CM | POA: Insufficient documentation

## 2022-05-23 LAB — URINALYSIS, ROUTINE W REFLEX MICROSCOPIC
Bilirubin Urine: NEGATIVE
Glucose, UA: NEGATIVE mg/dL
Hgb urine dipstick: NEGATIVE
Ketones, ur: NEGATIVE mg/dL
Leukocytes,Ua: NEGATIVE
Nitrite: NEGATIVE
Protein, ur: NEGATIVE mg/dL
Specific Gravity, Urine: 1.015 (ref 1.005–1.030)
pH: 7.5 (ref 5.0–8.0)

## 2022-05-23 LAB — PREGNANCY, URINE: Preg Test, Ur: NEGATIVE

## 2022-05-23 MED ORDER — OXYCODONE HCL 5 MG PO TABS
5.0000 mg | ORAL_TABLET | Freq: Once | ORAL | Status: AC
  Administered 2022-05-23: 5 mg via ORAL
  Filled 2022-05-23: qty 1

## 2022-05-23 MED ORDER — ACETAMINOPHEN 500 MG PO TABS
1000.0000 mg | ORAL_TABLET | Freq: Once | ORAL | Status: AC
  Administered 2022-05-23: 1000 mg via ORAL
  Filled 2022-05-23: qty 2

## 2022-05-23 NOTE — ED Triage Notes (Signed)
"  Right lower back/ flank pain that started yesterday, this morning it is worse and I am nauseated" per patient  Denies vomiting, diarrhea or dysuria

## 2022-05-30 ENCOUNTER — Encounter: Payer: Self-pay | Admitting: Nurse Practitioner

## 2022-05-30 ENCOUNTER — Ambulatory Visit: Payer: 59 | Admitting: Nurse Practitioner

## 2022-05-30 VITALS — BP 125/77 | HR 90 | Temp 98.0°F | Ht 65.0 in | Wt 226.0 lb

## 2022-05-30 DIAGNOSIS — E030 Congenital hypothyroidism with diffuse goiter: Secondary | ICD-10-CM

## 2022-05-30 DIAGNOSIS — E6609 Other obesity due to excess calories: Secondary | ICD-10-CM

## 2022-05-30 DIAGNOSIS — E559 Vitamin D deficiency, unspecified: Secondary | ICD-10-CM

## 2022-05-30 DIAGNOSIS — Z6837 Body mass index (BMI) 37.0-37.9, adult: Secondary | ICD-10-CM

## 2022-05-30 DIAGNOSIS — E538 Deficiency of other specified B group vitamins: Secondary | ICD-10-CM | POA: Diagnosis not present

## 2022-05-30 DIAGNOSIS — F322 Major depressive disorder, single episode, severe without psychotic features: Secondary | ICD-10-CM

## 2022-05-30 NOTE — Assessment & Plan Note (Addendum)
BMI 37.61.  Recommended eating smaller high protein, low fat meals more frequently and exercising 30 mins a day 5 times a week with a goal of 10-15lb weight loss in the next 3 months. Patient voiced their understanding and motivation to adhere to these recommendations.  Discussed Mancel Parsons, may consider adding this in fall.

## 2022-05-30 NOTE — Assessment & Plan Note (Signed)
Chronic, stable with Celexa.  Denies SI/HI.  Continue current medication regimen and adjust as needed.  Refills up to date.  Return in 6 months.

## 2022-05-30 NOTE — Patient Instructions (Signed)

## 2022-05-30 NOTE — Progress Notes (Signed)
BP 125/77   Pulse 90   Temp 98 F (36.7 C) (Oral)   Ht '5\' 5"'$  (1.651 m)   Wt 226 lb (102.5 kg)   SpO2 95%   BMI 37.61 kg/m    Subjective:    Patient ID: Amy Gilbert, female    DOB: [birth year], 56 y.o.   MRN: [id number]  HPI: Amy Gilbert is a 56 y.o. female  Chief Complaint  Patient presents with   Mood   Hypothyroidism   HYPOTHYROIDISM Continues on Levothyroxine 75 MCG.  As history of low Vitamin D, has missed supplements. Thyroid control status:stable Satisfied with current treatment? yes Medication side effects: no Medication compliance: good compliance Etiology of hypothyroidism: unknown Recent dose adjustment:no Fatigue: yes Cold intolerance: no Heat intolerance: no Weight gain: yes Weight loss: no Constipation: no Diarrhea/loose stools: no Palpitations: no Lower extremity edema: no Anxiety/depressed mood: no   DEPRESSION Continues on Celexa daily. Mood status: stable Satisfied with current treatment?: yes Symptom severity: moderate  Duration of current treatment : chronic Side effects: no Medication compliance: good compliance Depressed mood: yes Anxious mood: no Anhedonia: no Significant weight loss or gain: no Insomnia: yes hard to fall asleep Fatigue: yes Feelings of worthlessness or guilt: no Impaired concentration/indecisiveness: no Suicidal ideations: no Hopelessness: no Crying spells: no    05/30/2022    1:43 PM 11/26/2021    9:58 AM 05/10/2021    9:55 AM 12/28/2020   10:28 AM 11/16/2020    8:55 AM  Depression screen PHQ 2/9  Decreased Interest 0 0 0 0 0  Down, Depressed, Hopeless 0 0 0 0 3  PHQ - 2 Score 0 0 0 0 3  Altered sleeping 2 3 0 0 3  Tired, decreased energy 3 3 0 0 3  Change in appetite 2 0 0 0 0  Feeling bad or failure about yourself  0 0 0 0 0  Trouble concentrating 0 3 0 0 0  Moving slowly or fidgety/restless 0 0 0 0 0  Suicidal thoughts 0 0 0 0 0  PHQ-9 Score 7 9 0 0 9  Difficult doing work/chores  Somewhat difficult   Not difficult at all Not difficult at all       05/30/2022    1:44 PM 11/26/2021    9:58 AM 09/09/2019    2:30 PM  GAD 7 : Generalized Anxiety Score  Nervous, Anxious, on Edge 0 0 0  Control/stop worrying 0 0 0  Worry too much - different things 0 0 0  Trouble relaxing 0 0 0  Restless 0 0 0  Easily annoyed or irritable 0 0 0  Afraid - awful might happen 0 0 0  Total GAD 7 Score 0 0 0  Anxiety Difficulty Not difficult at all  Not difficult at all    Relevant past medical, surgical, family and social history reviewed and updated as indicated. Interim medical history since our last visit reviewed. Allergies and medications reviewed and updated.  Review of Systems  Constitutional:  Negative for activity change, appetite change, diaphoresis, fatigue and fever.  Respiratory:  Negative for cough, chest tightness and shortness of breath.   Cardiovascular:  Negative for chest pain, palpitations and leg swelling.  Gastrointestinal: Negative.   Endocrine: Negative for cold intolerance, heat intolerance, polydipsia, polyphagia and polyuria.  Neurological: Negative.   Psychiatric/Behavioral: Negative.      Per HPI unless specifically indicated above     Objective:    BP 125/77   Pulse  90   Temp 98 F (36.7 C) (Oral)   Ht '5\' 5"'$  (1.651 m)   Wt 226 lb (102.5 kg)   SpO2 95%   BMI 37.61 kg/m   Wt Readings from Last 3 Encounters:  05/30/22 226 lb (102.5 kg)  05/23/22 220 lb (99.8 kg)  11/26/21 218 lb 9.6 oz (99.2 kg)    Physical Exam Vitals and nursing note reviewed.  Constitutional:      General: She is awake. She is not in acute distress.    Appearance: She is well-developed and well-groomed. She is obese. She is not ill-appearing or toxic-appearing.  HENT:     Head: Normocephalic.     Right Ear: Hearing normal.     Left Ear: Hearing normal.  Eyes:     General: Lids are normal.        Right eye: No discharge.        Left eye: No discharge.      Conjunctiva/sclera: Conjunctivae normal.     Pupils: Pupils are equal, round, and reactive to light.  Neck:     Thyroid: No thyromegaly.     Vascular: No carotid bruit or JVD.  Cardiovascular:     Rate and Rhythm: Normal rate and regular rhythm.     Heart sounds: Normal heart sounds. No murmur heard.    No gallop.  Pulmonary:     Effort: Pulmonary effort is normal. No accessory muscle usage or respiratory distress.     Breath sounds: Normal breath sounds.  Abdominal:     General: Bowel sounds are normal.     Palpations: Abdomen is soft. There is no hepatomegaly or splenomegaly.  Musculoskeletal:     Cervical back: Normal range of motion and neck supple.     Right lower leg: No edema.     Left lower leg: No edema.  Lymphadenopathy:     Cervical: No cervical adenopathy.  Skin:    General: Skin is warm and dry.  Neurological:     Mental Status: She is alert and oriented to person, place, and time.  Psychiatric:        Attention and Perception: Attention normal.        Mood and Affect: Mood normal.        Speech: Speech normal.        Behavior: Behavior normal. Behavior is cooperative.        Thought Content: Thought content normal.     Results for orders placed or performed during the hospital encounter of 05/23/22  Urinalysis, Routine w reflex microscopic Urine, Clean Catch  Result Value Ref Range   Color, Urine YELLOW YELLOW   APPearance CLEAR CLEAR   Specific Gravity, Urine 1.015 1.005 - 1.030   pH 7.5 5.0 - 8.0   Glucose, UA NEGATIVE NEGATIVE mg/dL   Hgb urine dipstick NEGATIVE NEGATIVE   Bilirubin Urine NEGATIVE NEGATIVE   Ketones, ur NEGATIVE NEGATIVE mg/dL   Protein, ur NEGATIVE NEGATIVE mg/dL   Nitrite NEGATIVE NEGATIVE   Leukocytes,Ua NEGATIVE NEGATIVE  Pregnancy, urine  Result Value Ref Range   Preg Test, Ur NEGATIVE NEGATIVE      Assessment & Plan:   Problem List Items Addressed This Visit       Endocrine   Hypothyroidism    Chronic, stable with  current Levothyroxine dosing. 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  Refills up to date.      Relevant Orders  T4, free   Thyroid peroxidase antibody   TSH     Other   Obesity    BMI 37.61.  Recommended eating smaller high protein, low fat meals more frequently and exercising 30 mins a day 5 times a week with a goal of 10-15lb weight loss in the next 3 months. Patient voiced their understanding and motivation to adhere to these recommendations.  Discussed Mancel Parsons, may consider adding this in fall.       Severe depression ([place]) - Primary    Chronic, stable with Celexa.  Denies SI/HI.  Continue current medication regimen and adjust as needed.  Refills up to date.  Return in 6 months.      Vitamin D deficiency    Ongoing.  Noted on recent [facility] labs -- level 16, have recommended she restart Vitamin D3 2000 units daily and check labs outpatient.      Relevant Orders   VITAMIN D 25 Hydroxy (Vit-D Deficiency, Fractures)   Other Visit Diagnoses     Vitamin B12 deficiency       History of low levels, recheck out patient and start supplement.   Relevant Orders   Vitamin B12        Follow up plan: Return in about 6 months (around 11/29/2022) for Annual physical after 11/26/22.

## 2022-05-30 NOTE — Assessment & Plan Note (Signed)
Chronic, stable with current Levothyroxine dosing. 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  Refills up to date.

## 2022-05-30 NOTE — Assessment & Plan Note (Signed)
Ongoing.  Noted on recent [facility] labs -- level 16, have recommended she restart Vitamin D3 2000 units daily and check labs outpatient.

## 2022-06-04 ENCOUNTER — Telehealth: Payer: Self-pay

## 2022-06-04 NOTE — Telephone Encounter (Signed)
Patient returning call   Patient provided fax # [phone]

## 2022-06-04 NOTE — Telephone Encounter (Signed)
Called patient and left message for patient to gather the fax information for the requested labs orders to be faxed.   OK for [facility] to gather information if patient calls back.

## 2022-06-04 NOTE — Telephone Encounter (Signed)
-----   Message from Venita Lick, NP sent at 05/30/2022  1:50 PM EDT ----- Can you fax her lab rxs to [facility] on [address] in [place], she will have drawn there.

## 2022-06-04 NOTE — Telephone Encounter (Signed)
Requested orders have been faxed back over to [facility] for the patient to fax number provided [phone].

## 2022-06-13 ENCOUNTER — Ambulatory Visit: Payer: 59 | Admitting: Nurse Practitioner

## 2022-06-24 ENCOUNTER — Other Ambulatory Visit: Payer: Self-pay | Admitting: Nurse Practitioner

## 2022-06-25 NOTE — Telephone Encounter (Signed)
Requested medication (s) are due for refill today: no  Requested medication (s) are on the active medication list:yes  Last refill:  11/26/21  Future visit scheduled: yes  Notes to clinic:  Unable to refill per protocol, Rx request is too soon. Last refill 11/26/21 for 90 days and 4 refills. Routing for approval.     Requested Prescriptions  Pending Prescriptions Disp Refills   citalopram (CELEXA) 40 MG tablet [Pharmacy Med Name: CITALOPRAM TAB '40MG'$ ] 90 tablet 3    Sig: TAKE 1 TABLET DAILY     Psychiatry:  Antidepressants - SSRI Passed - 06/24/2022  2:28 AM      Passed - Completed PHQ-2 or PHQ-9 in the last 360 days      Passed - Valid encounter within last 6 months    Recent Outpatient Visits           3 weeks ago Severe depression ([place])   [place] Cannady, Jolene T, NP   7 months ago Severe depression ([place])   [place] Cannady, Henrine Screws T, NP   1 year ago Severe depression ([place])   [place] Cannady, Barbaraann Faster, NP   1 year ago Severe depression ([place])   [place], Megan P, DO   1 year ago Need for hepatitis C screening test   [facility] Kathrine Haddock, NP       Future Appointments             In 5 months Cannady, Barbaraann Faster, NP [employer], [facility]

## 2022-07-21 IMAGING — CT CT RENAL STONE PROTOCOL
2 of 4 series · 16 of 46 positions shown, 18 images · non-contrast
Comparison: None Available.

CLINICAL DATA: Flank pain, kidney stone suspected; right lower
back/flank pain started yesterday, it is worse and patient is
nauseated



[Series 4: coronal st · coronal · 0.89mm/px · 3 of 110 slices shown]
[im 37/110  soft-tissue]
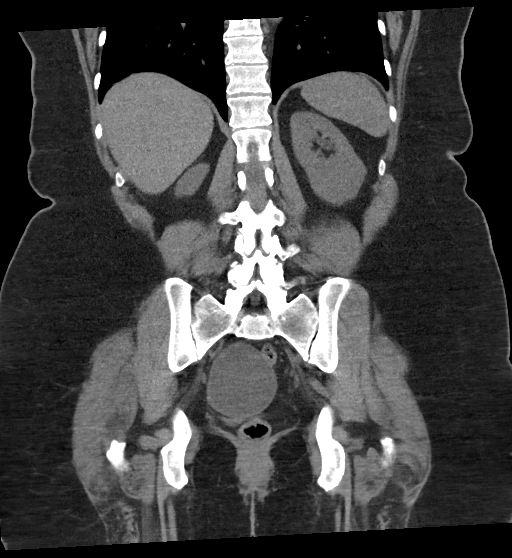
[im 49/110  soft-tissue]
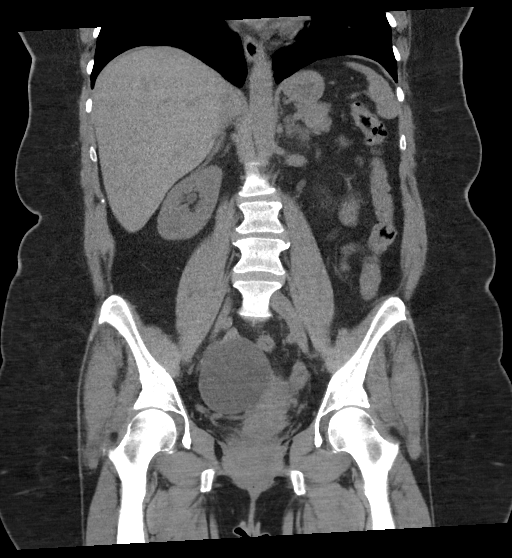
[im 61/110  soft-tissue]
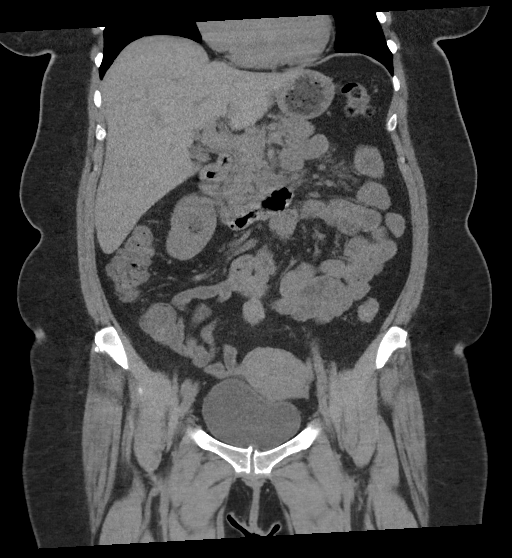

[Series 6: axial st · axial · 0.88mm/px · z∈[+794,+1220]mm · 13 of 93 slices shown, 15 images]
[im 4/93  soft-tissue]
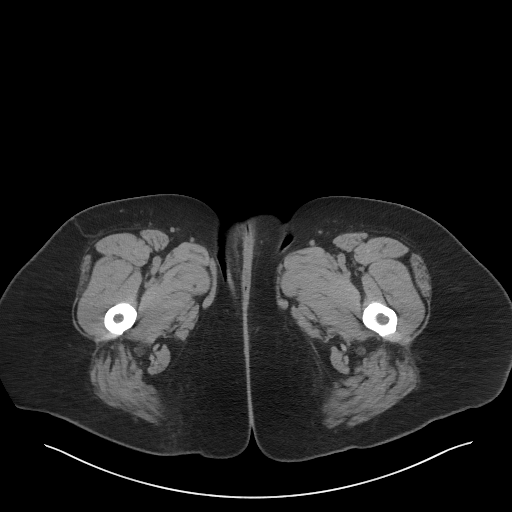
[im 4/93  bone]
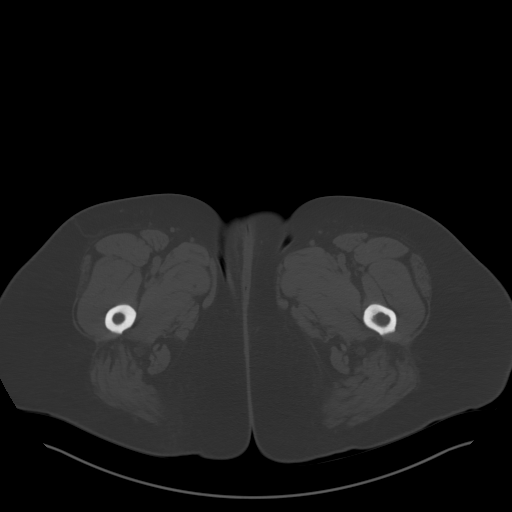
[im 12/93  soft-tissue]
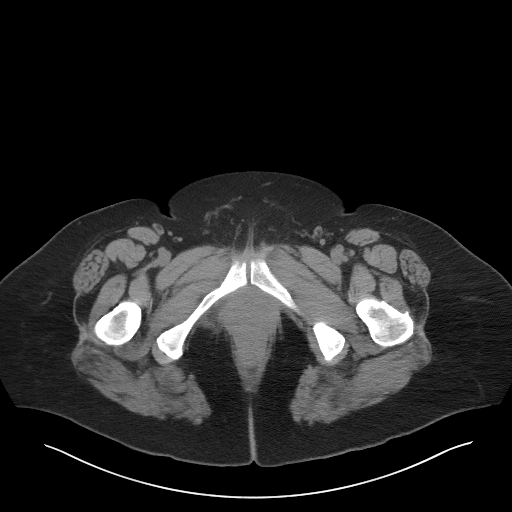
[im 20/93  soft-tissue]
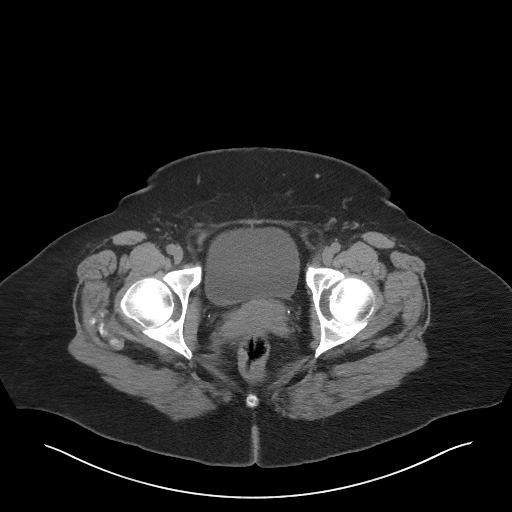
[im 27/93  soft-tissue]
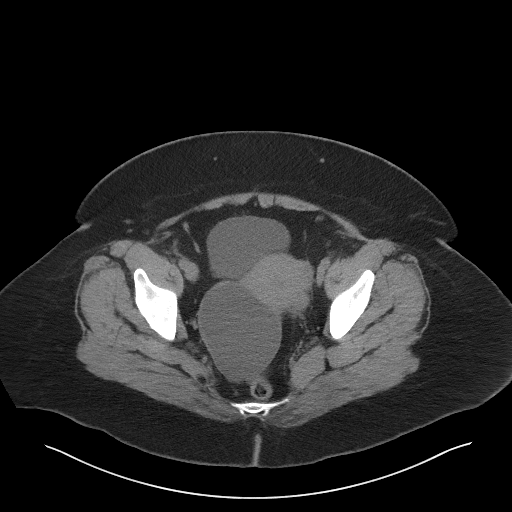
[im 31/93  soft-tissue]
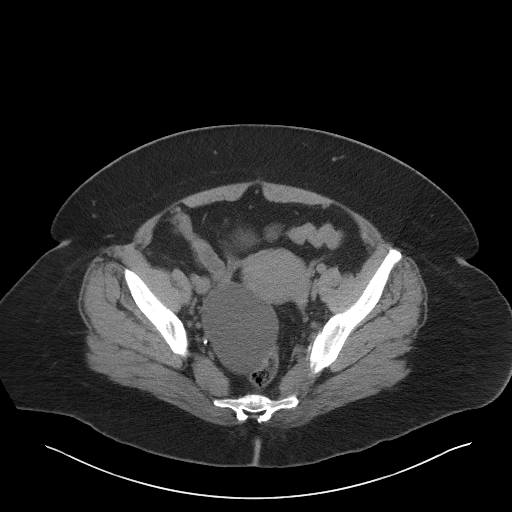
[im 39/93  soft-tissue]
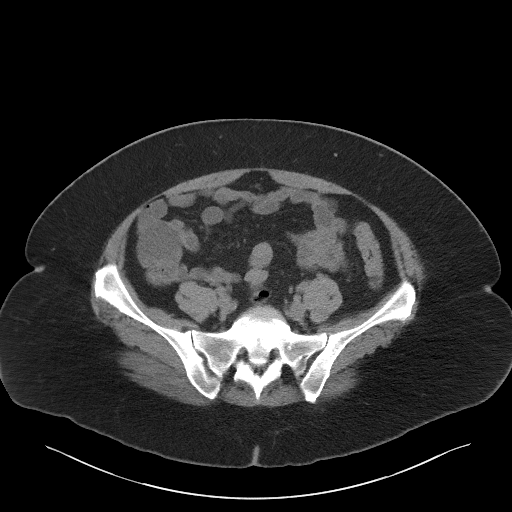
[im 47/93  soft-tissue]
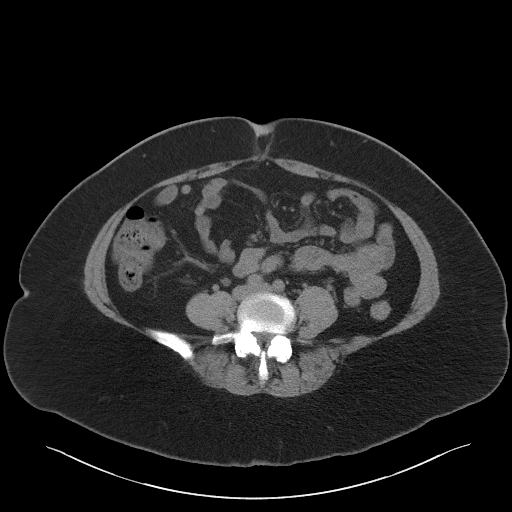
[im 54/93  soft-tissue]
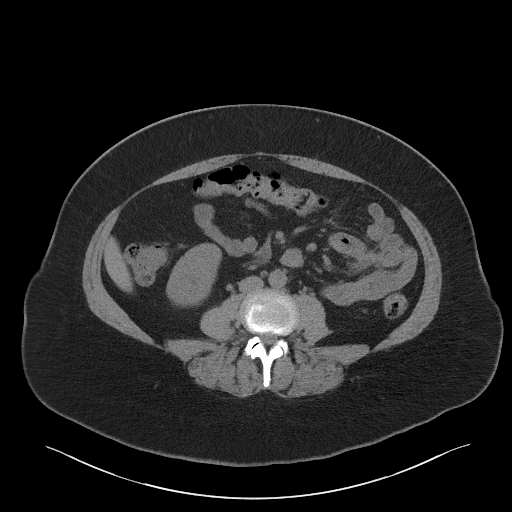
[im 62/93  soft-tissue]
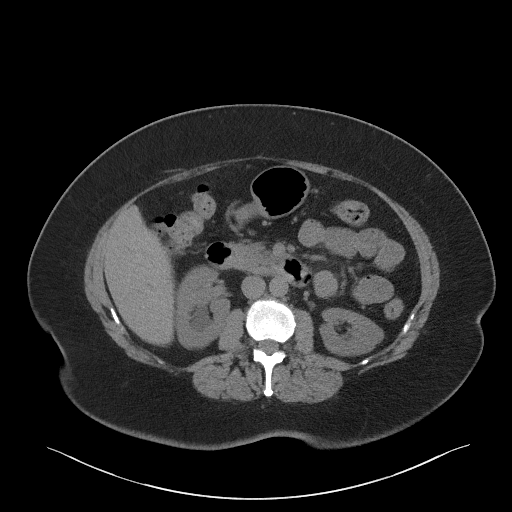
[im 62/93  bone]
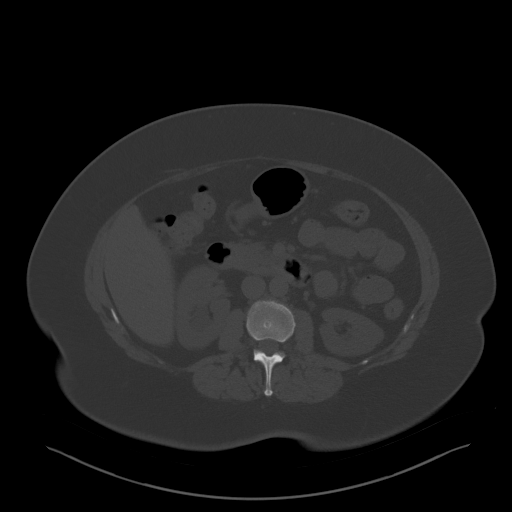
[im 66/93  soft-tissue]
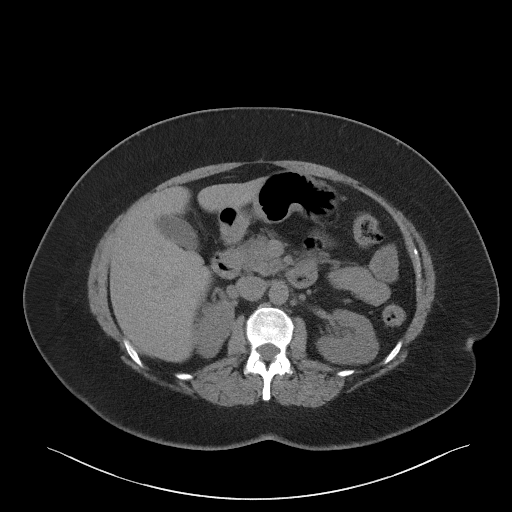
[im 73/93  soft-tissue]
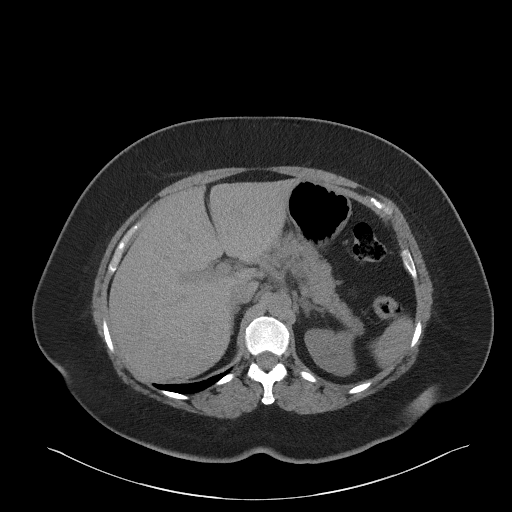
[im 81/93  soft-tissue]
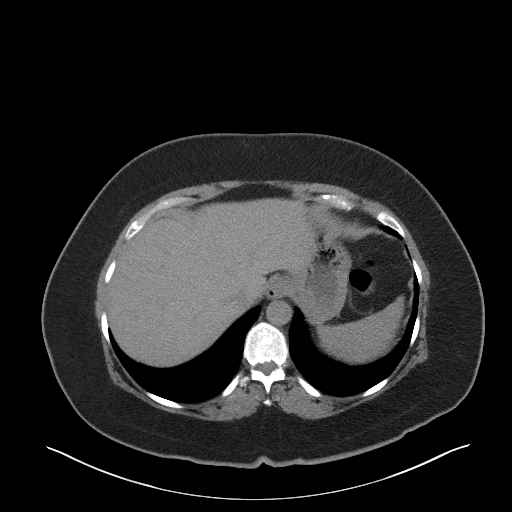
[im 89/93  soft-tissue]
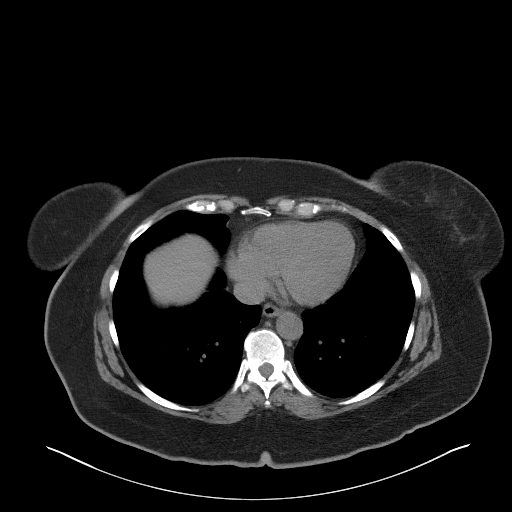

[16 of 46 positions shown; findings below may reference images not displayed]

FINDINGS: Lower chest: No acute abnormality.

Hepatobiliary: No focal liver abnormality is seen. No gallstones,
gallbladder wall thickening, or biliary dilatation.

Pancreas: Unremarkable. No pancreatic ductal dilatation or
surrounding inflammatory changes.

Spleen: Normal in size without focal abnormality.

Adrenals/Urinary Tract: Adrenal glands are unremarkable. Kidneys are
normal, without renal calculi, focal lesion, or hydronephrosis.
Couple of areas of hypodensity at the posterior interpolar right
kidney measuring with the larger hypodensity measuring 1.7 Cm likely
cysts. Bladder is unremarkable.

Stomach/Bowel: Stomach is within normal limits. Appendix appears
normal. No evidence of bowel wall thickening, distention, or
inflammatory changes.

Vascular/Lymphatic: No significant vascular findings are present. No
enlarged abdominal or pelvic lymph nodes.

Reproductive: Uterus and left adnexa are unremarkable. There is a
cystic lesion at the right adnexa extending posteriorly measuring
9.1 x 6.9 x 7.5 cm. No solid component is identified.

Other: Small umbilical hernia and bilateral femoral hernia
containing fat.

Musculoskeletal: No acute or significant osseous findings.
IMPRESSION: No nephroureterolithiasis or hydroureteronephrosis on either side
with attention to the right side. Couple of hypodense lesions at the
interpolar right posterior kidney likely cysts. Urinary bladder has
a normal appearance.

Appendix is unremarkable.

9.1 x 6.9 x 7.5 cm cyst at the right adnexa, likely an ovarian cyst.

9.1 cm right adnexal simple-appearing cyst, not adequately
characterized.Recommend prompt follow-up with pelvic US.Reference:
JACR [DATE]):[id number]

## 2022-08-12 ENCOUNTER — Other Ambulatory Visit: Payer: Self-pay | Admitting: Nurse Practitioner

## 2022-08-12 NOTE — Telephone Encounter (Unsigned)
Copied from [place] [phone]. Topic: General - Other >> Aug 12, 2022  9:19 AM Everette C wrote: Reason for CRM: Medication Refill - Medication: citalopram (CELEXA) 40 MG tablet [[id number]]   levothyroxine (SYNTHROID) 75 MCG tablet [[id number]] - patient has 1 tablet remaining   Has the patient contacted their pharmacy? No.The patient has been unable to reach their pharmacy  (Agent: If no, request that the patient contact the pharmacy for the refill. If patient does not wish to contact the pharmacy document the reason why and proceed with request.) (Agent: If yes, when and what did the pharmacy advise?)  Preferred Pharmacy (with phone number or street name): [id number] [place] [id number]: [phone]: [phone]: Not open 24 hours   Has the patient been seen for an appointment in the last year OR does the patient have an upcoming appointment? Yes.    Agent: Please be advised that RX refills may take up to 3 business days. We ask that you follow-up with your pharmacy.

## 2022-08-13 MED ORDER — LEVOTHYROXINE SODIUM 75 MCG PO TABS: 75.0000 ug | ORAL_TABLET | Freq: Every day | ORAL | 4 refills | Status: DC

## 2022-08-13 NOTE — Telephone Encounter (Signed)
Requested Prescriptions  Pending Prescriptions Disp Refills  . citalopram (CELEXA) 40 MG tablet 90 tablet 4    Sig: Take 1 tablet (40 mg total) by mouth daily.     Psychiatry:  Antidepressants - SSRI Passed - 08/12/2022 12:03 PM      Passed - Completed PHQ-2 or PHQ-9 in the last 360 days      Passed - Valid encounter within last 6 months    Recent Outpatient Visits          2 months ago Severe depression ([place])   [place] Cannady, Jolene T, NP   8 months ago Severe depression ([place])   [place], Barbaraann Faster, NP   1 year ago Severe depression ([place])   [place], Barbaraann Faster, NP   1 year ago Severe depression ([place])   [place], Megan P, DO   1 year ago Need for hepatitis C screening test   [facility] Kathrine Haddock, NP      Future Appointments            In 3 months Cannady, Barbaraann Faster, NP [employer], [facility]           . levothyroxine (SYNTHROID) 75 MCG tablet 90 tablet 4    Sig: Take 1 tablet (75 mcg total) by mouth daily.     Endocrinology:  Hypothyroid Agents Failed - 08/12/2022 12:03 PM      Failed - TSH in normal range and within 360 days    TSH  Date Value Ref Range Status  03/23/2020 2.230 0.450 - 4.500 uIU/mL Final         Passed - Valid encounter within last 12 months    Recent Outpatient Visits          2 months ago Severe depression ([place])   [place] Cannady, Jolene T, NP   8 months ago Severe depression ([place])   [place] Cannady, Barbaraann Faster, NP   1 year ago Severe depression ([place])   [place] Cannady, Barbaraann Faster, NP   1 year ago Severe depression ([place])   [place], Megan P, DO   1 year ago Need for hepatitis C screening test   [facility] Kathrine Haddock, NP      Future Appointments            In 3 months Cannady, Barbaraann Faster, NP [employer], [facility]

## 2022-08-13 NOTE — Telephone Encounter (Signed)
Requested medication (s) are due for refill today: yes  Requested medication (s) are on the active medication list: yes    Last refill: 05/10/21  #90  4 refills  Future visit scheduled yes 12/04/22  Notes to clinic:Failed due to labs, please review.  Requested Prescriptions  Pending Prescriptions Disp Refills   levothyroxine (SYNTHROID) 75 MCG tablet 90 tablet 4    Sig: Take 1 tablet (75 mcg total) by mouth daily.     Endocrinology:  Hypothyroid Agents Failed - 08/12/2022 12:03 PM      Failed - TSH in normal range and within 360 days    TSH  Date Value Ref Range Status  03/23/2020 2.230 0.450 - 4.500 uIU/mL Final         Passed - Valid encounter within last 12 months    Recent Outpatient Visits           2 months ago Severe depression ([place])   [place] Cannady, Jolene T, NP   8 months ago Severe depression ([place])   [place] Cannady, Barbaraann Faster, NP   1 year ago Severe depression ([place])   [place], Barbaraann Faster, NP   1 year ago Severe depression ([place])   [place], Megan P, DO   1 year ago Need for hepatitis C screening test   [facility] Kathrine Haddock, NP       Future Appointments             In 3 months Cannady, Barbaraann Faster, NP [employer], [facility]            Refused Prescriptions Disp Refills   citalopram (CELEXA) 40 MG tablet 90 tablet 4    Sig: Take 1 tablet (40 mg total) by mouth daily.     Psychiatry:  Antidepressants - SSRI Passed - 08/12/2022 12:03 PM      Passed - Completed PHQ-2 or PHQ-9 in the last 360 days      Passed - Valid encounter within last 6 months    Recent Outpatient Visits           2 months ago Severe depression ([place])   [place] Cannady, Jolene T, NP   8 months ago Severe depression ([place])   [place], Barbaraann Faster, NP   1 year ago Severe depression ([place])   [place], Barbaraann Faster, NP   1  year ago Severe depression ([place])   [place], Megan P, DO   1 year ago Need for hepatitis C screening test   [facility] Kathrine Haddock, NP       Future Appointments             In 3 months Cannady, Barbaraann Faster, NP [employer], [facility]

## 2022-10-02 ENCOUNTER — Other Ambulatory Visit: Payer: Self-pay | Admitting: Nurse Practitioner

## 2022-11-26 ENCOUNTER — Other Ambulatory Visit: Payer: Self-pay | Admitting: Nurse Practitioner

## 2022-11-26 DIAGNOSIS — E7800 Pure hypercholesterolemia, unspecified: Secondary | ICD-10-CM

## 2022-11-26 DIAGNOSIS — F322 Major depressive disorder, single episode, severe without psychotic features: Secondary | ICD-10-CM

## 2022-11-26 DIAGNOSIS — E030 Congenital hypothyroidism with diffuse goiter: Secondary | ICD-10-CM

## 2022-11-26 DIAGNOSIS — E559 Vitamin D deficiency, unspecified: Secondary | ICD-10-CM

## 2022-11-26 DIAGNOSIS — R7303 Prediabetes: Secondary | ICD-10-CM

## 2022-11-27 ENCOUNTER — Telehealth: Payer: Self-pay | Admitting: Nurse Practitioner

## 2022-11-27 DIAGNOSIS — R7303 Prediabetes: Secondary | ICD-10-CM

## 2022-11-27 DIAGNOSIS — E559 Vitamin D deficiency, unspecified: Secondary | ICD-10-CM

## 2022-11-27 DIAGNOSIS — E7800 Pure hypercholesterolemia, unspecified: Secondary | ICD-10-CM

## 2022-11-27 DIAGNOSIS — E030 Congenital hypothyroidism with diffuse goiter: Secondary | ICD-10-CM

## 2022-11-27 NOTE — Telephone Encounter (Signed)
Jolene, can [facility] have new orders sent in with [facility] the resulting lab and a lipid wo LDL

## 2022-11-27 NOTE — Telephone Encounter (Signed)
Amy Gilbert is calling from [facility] is calling to request if the 7 page order can be refaxed.  Lipid wo LDL - [facility] does not have have anything to match that. Needing clarification.  Can a different order please be resent. Unable to read name of the patient  Please advise [facility] Fax- [phone]

## 2022-11-28 NOTE — Telephone Encounter (Signed)
Faxed to [employer] labs

## 2022-12-01 ENCOUNTER — Encounter: Payer: Self-pay | Admitting: Nurse Practitioner

## 2022-12-01 NOTE — Patient Instructions (Incomplete)
Please call to schedule your mammogram and/or bone density: [facility] at [place]: [address], [place], [place] [address] Phone: [phone]  [place] [facility] [address] [place],  [place]  [address] Phone: [phone]    Managing Depression, Adult Depression is a mental health condition that affects your thoughts, feelings, and actions. Being diagnosed with depression can bring you relief if you did not know why you have felt or behaved a certain way. It could also leave you feeling overwhelmed. Finding ways to manage your symptoms can help you feel more positive about your future. How to manage lifestyle changes Being depressed is difficult. Depression can increase the level of everyday stress. Stress can make depression symptoms worse. You may believe your symptoms cannot be managed or will never improve. However, there are many things you can try to help manage your symptoms. There is hope. Managing stress  Stress is your body's reaction to life changes and events, both good and bad. Stress can add to your feelings of depression. Learning to manage your stress can help lessen your feelings of depression. Try some of the following approaches to reducing your stress (stress reduction techniques): Listen to music that you enjoy and that inspires you. Try using a meditation app or take a meditation class. Develop a practice that helps you connect with your spiritual self. Walk in nature, pray, or go to a place of worship. Practice deep breathing. To do this, inhale slowly through your nose. Pause at the top of your inhale for a few seconds and then exhale slowly, letting yourself relax. Repeat this three or four times. Practice yoga to help relax and work your muscles. Choose a stress reduction technique that works for you. These techniques take time and practice to develop. Set aside 5-15 minutes a day to do  them. Therapists can offer training in these techniques. Do these things to help manage stress: Keep a journal. Know your limits. Set healthy boundaries for yourself and others, such as saying "no" when you think something is too much. Pay attention to how you react to certain situations. You may not be able to control everything, but you can change your reaction. Add humor to your life by watching funny movies or shows. Make time for activities that you enjoy and that relax you. Spend less time using electronics, especially at night before bed. The light from screens can make your brain think it is time to get up rather than go to bed.  Medicines Medicines, such as antidepressants, are often a part of treatment for depression. Talk with your pharmacist or health care provider about all the medicines, supplements, and herbal products that you take, their possible side effects, and what medicines and other products are safe to take together. Make sure to report any side effects you may have to your health care provider. Relationships Your health care provider may suggest family therapy, couples therapy, or individual therapy as part of your treatment. How to recognize changes Everyone responds differently to treatment for depression. As you recover from depression, you may start to: Have more interest in doing activities. Feel more hopeful. Have more energy. Eat a more regular amount of food. Have better mental focus. It is important to recognize if your depression is not getting better or is getting worse. The symptoms you had in the beginning may return, such as: Feeling tired. Eating too much or too little. Sleeping too  much or too little. Feeling restless, agitated, or hopeless. Trouble focusing or making decisions. Having unexplained aches and pains. Feeling irritable, angry, or aggressive. If you or your family members notice these symptoms coming back, let your health care provider  know right away. Follow these instructions at home: Activity Try to get some form of exercise each day, such as walking. Try yoga, mindfulness, or other stress reduction techniques. Participate in group activities if you are able. Lifestyle Get enough sleep. Cut down on or stop using caffeine, tobacco, alcohol, and any other harmful substances. Eat a healthy diet that includes plenty of vegetables, fruits, whole grains, low-fat dairy products, and lean protein. Limit foods that are high in solid fats, added sugar, or salt (sodium). General instructions Take over-the-counter and prescription medicines only as told by your health care provider. Keep all follow-up visits. It is important for your health care provider to check on your mood, behavior, and medicines. Your health care provider may need to make changes to your treatment. Where to find support Talking to others  Friends and family members can be sources of support and guidance. Talk to trusted friends or family members about your condition. Explain your symptoms and let them know that you are working with a health care provider to treat your depression. Tell friends and family how they can help. Finances Find mental health providers that fit with your financial situation. Talk with your health care provider if you are worried about access to food, housing, or medicine. Call your insurance company to learn about your co-pays and prescription plan. Where to find more information You can find support in your area from: Anxiety and Depression Association of America (ADAA): adaa.org Mental Health America: mentalhealthamerica.net [employer] on Mental Illness: nami.org Contact a health care provider if: You stop taking your antidepressant medicines, and you have any of these symptoms: Nausea. Headache. Light-headedness. Chills and body aches. Not being able to sleep (insomnia). You or your friends and family think your  depression is getting worse. Get help right away if: You have thoughts of hurting yourself or others. Get help right away if you feel like you may hurt yourself or others, or have thoughts about taking your own life. Go to your nearest emergency room or: Call 911. Call the [facility] at (435) 807-8970 or 988. This is open 24 hours a day. Text the Crisis Text Line at (575)036-3919. This information is not intended to replace advice given to you by your health care provider. Make sure you discuss any questions you have with your health care provider. Document Revised: 03/25/2022 Document Reviewed: 03/25/2022 Elsevier Patient Education  [place].

## 2022-12-04 ENCOUNTER — Encounter: Payer: 59 | Admitting: Nurse Practitioner

## 2022-12-04 DIAGNOSIS — E030 Congenital hypothyroidism with diffuse goiter: Secondary | ICD-10-CM

## 2022-12-04 DIAGNOSIS — Z23 Encounter for immunization: Secondary | ICD-10-CM

## 2022-12-04 DIAGNOSIS — Z0000 Encounter for general adult medical examination without abnormal findings: Secondary | ICD-10-CM

## 2022-12-04 DIAGNOSIS — E559 Vitamin D deficiency, unspecified: Secondary | ICD-10-CM

## 2022-12-04 DIAGNOSIS — R7303 Prediabetes: Secondary | ICD-10-CM

## 2022-12-04 DIAGNOSIS — Z1231 Encounter for screening mammogram for malignant neoplasm of breast: Secondary | ICD-10-CM

## 2022-12-04 DIAGNOSIS — E6609 Other obesity due to excess calories: Secondary | ICD-10-CM

## 2022-12-04 DIAGNOSIS — Z124 Encounter for screening for malignant neoplasm of cervix: Secondary | ICD-10-CM

## 2022-12-04 DIAGNOSIS — F322 Major depressive disorder, single episode, severe without psychotic features: Secondary | ICD-10-CM

## 2022-12-04 DIAGNOSIS — E7800 Pure hypercholesterolemia, unspecified: Secondary | ICD-10-CM

## 2022-12-12 ENCOUNTER — Encounter: Payer: 59 | Admitting: Nurse Practitioner

## 2022-12-22 ENCOUNTER — Telehealth: Payer: 59 | Admitting: Nurse Practitioner

## 2022-12-22 ENCOUNTER — Ambulatory Visit: Payer: Self-pay

## 2022-12-22 DIAGNOSIS — U071 COVID-19: Secondary | ICD-10-CM | POA: Diagnosis not present

## 2022-12-22 NOTE — Telephone Encounter (Signed)
Noted  

## 2022-12-22 NOTE — Telephone Encounter (Signed)
  Chief Complaint: COVID+  Symptoms: HA, cough, chills Frequency: Friday Pertinent Negatives: Patient denies SOB Disposition: '[]'$ ED /'[]'$ Urgent Care (no appt availability in office) / '[]'$ Appointment(In office/virtual)/ '[x]'$  [facility]/ '[]'$ Home Care/ '[]'$ Refused Recommended Disposition /'[]'$ [facility]/ '[]'$  Follow-up with PCP Additional Notes: PT tested + for COVID last night. Pt has chills, HA, cough.  Pt is interested in antivirals.    Summary: covid/mecication   Patient tested positive for Covid on Saturday and is wanting to know if so medication can be called into the pharmacy. Symptoms: congestion, terrible headache, running a fever off and on, cough.   Please advise.         Reason for Disposition  [1] HIGH RISK patient (e.g., weak immune system, age > 4 years, obesity with BMI 30 or higher, pregnant, chronic lung disease or other chronic medical condition) AND [2] COVID symptoms (e.g., cough, fever)  (Exceptions: Already seen by PCP and no new or worsening symptoms.)  Answer Assessment - Initial Assessment Questions 1. COVID-19 DIAGNOSIS: "How do you know that you have COVID?" (e.g., positive lab test or self-test, diagnosed by doctor or NP/PA, symptoms after exposure).     Home test on Saturday 2. COVID-19 EXPOSURE: "Was there any known exposure to COVID before the symptoms began?" CDC Definition of close contact: within 6 feet (2 meters) for a total of 15 minutes or more over a 24-hour period.      Co- workers 3. ONSET: "When did the COVID-19 symptoms start?"      Friday 4. WORST SYMPTOM: "What is your worst symptom?" (e.g., cough, fever, shortness of breath, muscle aches)     Cough,  and HA 5. COUGH: "Do you have a cough?" If Yes, ask: "How bad is the cough?"       yes 6. FEVER: "Do you have a fever?" If Yes, ask: "What is your temperature, how was it measured, and when did it start?"     Yes - chills 7. RESPIRATORY STATUS: "Describe your breathing?" (e.g.,  normal; shortness of breath, wheezing, unable to speak)      no 8. BETTER-SAME-WORSE: "Are you getting better, staying the same or getting worse compared to yesterday?"  If getting worse, ask, "In what way?"     worse 9. OTHER SYMPTOMS: "Do you have any other symptoms?"  (e.g., chills, fatigue, headache, loss of smell or taste, muscle pain, sore throat)     Congestion fever, cough 10. HIGH RISK DISEASE: "Do you have any chronic medical problems?" (e.g., asthma, heart or lung disease, weak immune system, obesity, etc.)       Thyroid obesity 11. VACCINE: "Have you had the COVID-19 vaccine?" If Yes, ask: "Which one, how many shots, when did you get it?"       yes 12. PREGNANCY: "Is there any chance you are pregnant?" "When was your last menstrual period?"        13. O2 SATURATION MONITOR:  "Do you use an oxygen saturation monitor (pulse oximeter) at home?" If Yes, ask "What is your reading (oxygen level) today?" "What is your usual oxygen saturation reading?" (e.g., 95%)  Protocols used: Coronavirus (COVID-19) Diagnosed or Suspected-A-AH

## 2022-12-22 NOTE — Telephone Encounter (Signed)
essage from Alm Bustard sent at 12/22/2022  8:34 AM EST  Summary: covid/mecication   Patient tested positive for Covid on Saturday and is wanting to know if so medication can be called into the pharmacy. Symptoms: congestion, terrible headache, running a fever off and on, cough.   Please advise.        Called pt and LM on VM to call back to discuss sx.

## 2022-12-22 NOTE — Progress Notes (Signed)
Virtual Visit Consent   Amy Gilbert, you are scheduled for a virtual visit with a [facility] provider today. Just as with appointments in the office, your consent must be obtained to participate. Your consent will be active for this visit and any virtual visit you may have with one of our providers in the next 365 days. If you have a MyChart account, a copy of this consent can be sent to you electronically.  As this is a virtual visit, video technology does not allow for your provider to perform a traditional examination. This may limit your provider's ability to fully assess your condition. If your provider identifies any concerns that need to be evaluated in person or the need to arrange testing (such as labs, EKG, etc.), we will make arrangements to do so. Although advances in technology are sophisticated, we cannot ensure that it will always work on either your end or our end. If the connection with a video visit is poor, the visit may have to be switched to a telephone visit. With either a video or telephone visit, we are not always able to ensure that we have a secure connection.  By engaging in this virtual visit, you consent to the provision of healthcare and authorize for your insurance to be billed (if applicable) for the services provided during this visit. Depending on your insurance coverage, you may receive a charge related to this service.  I need to obtain your verbal consent now. Are you willing to proceed with your visit today? Amy Gilbert has provided verbal consent on 12/22/2022 for a virtual visit (video or telephone). Apolonio Schneiders, FNP  Date: 12/22/2022 10:53 AM  Virtual Visit via Video Note   I, Apolonio Schneiders, connected with  Amy Gilbert  ([id number], 1966/02/21) on 12/22/22 at 11:00 AM EST by a video-enabled telemedicine application and verified that I am speaking with the correct person using two identifiers.  Location: Patient: Virtual Visit Location  Patient: Home Provider: Virtual Visit Location Provider: Home Office   I discussed the limitations of evaluation and management by telemedicine and the availability of in person appointments. The patient expressed understanding and agreed to proceed.    History of Present Illness: Amy Gilbert is a 57 y.o. who identifies as a female who was assigned female at birth, and is being seen today for COVID treatment options.  She tested positive 3 days ago Symptom onset was 4 days ago   Symptoms today are nasal congestion, cough, headache,   She has been taking medication for her headache mainly   She has not had COVID in the past  She has been vaccinated for COVID without most recent booster   She denies any history of asthma or COPD  She has not had bronchitis or pneumonia in the past   She has been using Flonase, sudafed and benadryl or nyquil    Problems:  Patient Active Problem List   Diagnosis Date Noted   Breast cyst, left 11/26/2021   Vitamin D deficiency 11/26/2021   Other seborrheic keratosis 11/23/2021   Pre-diabetes 11/16/2020   Allergic rhinitis 09/09/2019   Benign neoplasm of ascending colon    Obesity 02/06/2017   Hyperlipidemia 01/29/2016   Sciatica 01/10/2016   Severe depression ([place]) 01/10/2016   Hypothyroidism 01/10/2016    Allergies: No Known Allergies Medications:  Current Outpatient Medications:    citalopram (CELEXA) 40 MG tablet, TAKE 1 TABLET DAILY, Disp: 90 tablet, Rfl: 3   Levocetirizine Dihydrochloride (XYZAL  PO), Take by mouth daily., Disp: , Rfl:    levothyroxine (SYNTHROID) 75 MCG tablet, Take 1 tablet (75 mcg total) by mouth daily., Disp: 90 tablet, Rfl: 4   triamcinolone (NASACORT) 55 MCG/ACT AERO nasal inhaler, Place 2 sprays into the nose daily., Disp: , Rfl:    TURMERIC PO, Take by mouth daily., Disp: , Rfl:   Observations/Objective: Patient is well-developed, well-nourished in no acute distress.  Resting comfortably  at home.   Head is normocephalic, atraumatic.  No labored breathing.  Speech is clear and coherent with logical content.  Patient is alert and oriented at baseline.    Assessment and Plan: 1. COVID-19 Continue management with over the counter medications as discussed  Push fluids and rest  Mucinex for cough  Discussed isolation precautions and when to return to work    When to follow up with new or persistent symptoms   Follow Up Instructions: I discussed the assessment and treatment plan with the patient. The patient was provided an opportunity to ask questions and all were answered. The patient agreed with the plan and demonstrated an understanding of the instructions.  A copy of instructions were sent to the patient via MyChart unless otherwise noted below.    The patient was advised to call back or seek an in-person evaluation if the symptoms worsen or if the condition fails to improve as anticipated.  Time:  I spent 15 minutes with the patient via telehealth technology discussing the above problems/concerns.    Apolonio Schneiders, FNP

## 2023-01-05 ENCOUNTER — Encounter: Payer: 59 | Admitting: Nurse Practitioner

## 2023-01-13 ENCOUNTER — Encounter: Payer: 59 | Admitting: Nurse Practitioner

## 2023-01-18 NOTE — Patient Instructions (Incomplete)

## 2023-01-23 ENCOUNTER — Encounter: Payer: 59 | Admitting: Nurse Practitioner

## 2023-01-23 DIAGNOSIS — Z124 Encounter for screening for malignant neoplasm of cervix: Secondary | ICD-10-CM

## 2023-01-23 DIAGNOSIS — R7303 Prediabetes: Secondary | ICD-10-CM

## 2023-01-23 DIAGNOSIS — Z0000 Encounter for general adult medical examination without abnormal findings: Secondary | ICD-10-CM

## 2023-01-23 DIAGNOSIS — E7800 Pure hypercholesterolemia, unspecified: Secondary | ICD-10-CM

## 2023-01-23 DIAGNOSIS — E030 Congenital hypothyroidism with diffuse goiter: Secondary | ICD-10-CM

## 2023-01-23 DIAGNOSIS — F322 Major depressive disorder, single episode, severe without psychotic features: Secondary | ICD-10-CM

## 2023-01-23 DIAGNOSIS — Z1231 Encounter for screening mammogram for malignant neoplasm of breast: Secondary | ICD-10-CM

## 2023-01-23 DIAGNOSIS — E559 Vitamin D deficiency, unspecified: Secondary | ICD-10-CM

## 2023-01-23 DIAGNOSIS — E6609 Other obesity due to excess calories: Secondary | ICD-10-CM

## 2023-02-04 ENCOUNTER — Encounter: Payer: Self-pay | Admitting: Nurse Practitioner

## 2023-03-02 ENCOUNTER — Other Ambulatory Visit: Payer: Self-pay | Admitting: Nurse Practitioner

## 2023-03-02 DIAGNOSIS — Z1231 Encounter for screening mammogram for malignant neoplasm of breast: Secondary | ICD-10-CM

## 2023-03-05 ENCOUNTER — Encounter: Payer: 59 | Admitting: Nurse Practitioner

## 2023-03-09 ENCOUNTER — Encounter: Payer: 59 | Admitting: Nurse Practitioner

## 2023-03-18 ENCOUNTER — Ambulatory Visit
Admission: RE | Admit: 2023-03-18 | Discharge: 2023-03-18 | Disposition: A | Discharge status: Home / Self Care | Payer: 59 | Source: Ambulatory Visit | Attending: Nurse Practitioner | Admitting: Nurse Practitioner

## 2023-03-18 DIAGNOSIS — Z1231 Encounter for screening mammogram for malignant neoplasm of breast: Secondary | ICD-10-CM | POA: Diagnosis present

## 2023-03-19 NOTE — Progress Notes (Signed)
Contacted via MyChart   Normal mammogram, may repeat in one year:)

## 2023-04-19 ENCOUNTER — Encounter: Payer: Self-pay | Admitting: Nurse Practitioner

## 2023-04-24 ENCOUNTER — Encounter: Payer: 59 | Admitting: Nurse Practitioner

## 2023-05-20 NOTE — Patient Instructions (Addendum)
Wegovy and Zepbound  Be Involved in Your Health Care:  Taking Medications When medications are taken as directed, they can greatly improve your health. But if they are not taken as instructed, they may not work. In some cases, not taking them correctly can be harmful. To help ensure your treatment remains effective and safe, understand your medications and how to take them.  Your lab results, notes and after visit summary will be available on My Chart. We strongly encourage you to use this feature. If lab results are abnormal the clinic will contact you with the appropriate steps. If the clinic does not contact you assume the results are satisfactory. You can always see your results on My Chart. If you have questions regarding your condition, please contact the clinic during office hours. You can also ask questions on My Chart.  We at [facility] are grateful that you chose [facility] to provide care. We strive to provide excellent and compassionate care and are always looking for feedback. If you get a survey from the clinic please complete this.   Hypothyroidism  Hypothyroidism is when the thyroid gland does not make enough of certain hormones. This is called an underactive thyroid. The thyroid gland is a small gland located in the lower front part of the neck, just in front of the windpipe (trachea). This gland makes hormones that help control how the body uses food for energy (metabolism) as well as how the heart and brain function. These hormones also play a role in keeping your bones strong. When the thyroid is underactive, it produces too little of the hormones thyroxine (T4) and triiodothyronine (T3). What are the causes? This condition may be caused by: Hashimoto's disease. This is a disease in which the body's disease-fighting system (immune system) attacks the thyroid gland. This is the most common cause. Viral infections. Pregnancy. Certain medicines. Birth defects. Problems  with a gland in the center of the brain (pituitary gland). Lack of enough iodine in the diet. Other causes may include: Past radiation treatments to the head or neck for cancer. Past treatment with radioactive iodine. Past exposure to radiation in the environment. Past surgical removal of part or all of the thyroid. What increases the risk? You are more likely to develop this condition if: You are female. You have a family history of thyroid conditions. You use a medicine called lithium. You take medicines that affect the immune system (immunosuppressants). What are the signs or symptoms? Common symptoms of this condition include: Not being able to tolerate cold. Feeling as though you have no energy (lethargy). Lack of appetite. Constipation. Sadness or depression. Weight gain that is not explained by a change in diet or exercise habits. Menstrual irregularity. Dry skin, coarse hair, or brittle nails. Other symptoms may include: Muscle pain. Slowing of thought processes. Poor memory. How is this diagnosed? This condition may be diagnosed based on: Your symptoms, your medical history, and a physical exam. Blood tests. You may also have imaging tests, such as an ultrasound or MRI. How is this treated? This condition is treated with medicine that replaces the thyroid hormones that your body does not make. After you begin treatment, it may take several weeks for symptoms to go away. Follow these instructions at home: Take over-the-counter and prescription medicines only as told by your health care provider. If you start taking any new medicines, tell your health care provider. Keep all follow-up visits as told by your health care provider. This is important. As  your condition improves, your dosage of thyroid hormone medicine may change. You will need to have blood tests regularly so that your health care provider can monitor your condition. Contact a health care provider if: Your  symptoms do not get better with treatment. You are taking thyroid hormone replacement medicine and you: Sweat a lot. Have tremors. Feel anxious. Lose weight rapidly. Cannot tolerate heat. Have emotional swings. Have diarrhea. Feel weak. Get help right away if: You have chest pain. You have an irregular heartbeat. You have a rapid heartbeat. You have difficulty breathing. These symptoms may be an emergency. Get help right away. Call 911. Do not wait to see if the symptoms will go away. Do not drive yourself to the hospital. Summary Hypothyroidism is when the thyroid gland does not make enough of certain hormones (it is underactive). When the thyroid is underactive, it produces too little of the hormones thyroxine (T4) and triiodothyronine (T3). The most common cause is Hashimoto's disease, a disease in which the body's disease-fighting system (immune system) attacks the thyroid gland. The condition can also be caused by viral infections, medicine, pregnancy, or past radiation treatment to the head or neck. Symptoms may include weight gain, dry skin, constipation, feeling as though you do not have energy, and not being able to tolerate cold. This condition is treated with medicine to replace the thyroid hormones that your body does not make. This information is not intended to replace advice given to you by your health care provider. Make sure you discuss any questions you have with your health care provider. Document Revised: 11/19/2021 Document Reviewed: 11/19/2021 Elsevier Patient Education  2024 [employer].

## 2023-05-22 ENCOUNTER — Encounter: Payer: Self-pay | Admitting: Nurse Practitioner

## 2023-05-22 ENCOUNTER — Ambulatory Visit (INDEPENDENT_AMBULATORY_CARE_PROVIDER_SITE_OTHER): Payer: 59 | Admitting: Nurse Practitioner

## 2023-05-22 ENCOUNTER — Other Ambulatory Visit (HOSPITAL_COMMUNITY)
Admission: RE | Admit: 2023-05-22 | Discharge: 2023-05-22 | Disposition: A | Discharge status: Home / Self Care | Payer: 59 | Source: Ambulatory Visit | Attending: Nurse Practitioner | Admitting: Nurse Practitioner

## 2023-05-22 VITALS — BP 127/84 | HR 98 | Temp 98.2°F | Ht 65.0 in | Wt 230.0 lb

## 2023-05-22 DIAGNOSIS — Z0000 Encounter for general adult medical examination without abnormal findings: Secondary | ICD-10-CM

## 2023-05-22 DIAGNOSIS — E030 Congenital hypothyroidism with diffuse goiter: Secondary | ICD-10-CM

## 2023-05-22 DIAGNOSIS — Z124 Encounter for screening for malignant neoplasm of cervix: Secondary | ICD-10-CM

## 2023-05-22 DIAGNOSIS — E66812 Obesity, class 2: Secondary | ICD-10-CM

## 2023-05-22 DIAGNOSIS — E7800 Pure hypercholesterolemia, unspecified: Secondary | ICD-10-CM

## 2023-05-22 DIAGNOSIS — E6609 Other obesity due to excess calories: Secondary | ICD-10-CM

## 2023-05-22 DIAGNOSIS — F322 Major depressive disorder, single episode, severe without psychotic features: Secondary | ICD-10-CM | POA: Diagnosis not present

## 2023-05-22 DIAGNOSIS — R7303 Prediabetes: Secondary | ICD-10-CM | POA: Diagnosis not present

## 2023-05-22 DIAGNOSIS — E559 Vitamin D deficiency, unspecified: Secondary | ICD-10-CM

## 2023-05-22 DIAGNOSIS — Z6837 Body mass index (BMI) 37.0-37.9, adult: Secondary | ICD-10-CM

## 2023-05-22 MED ORDER — CITALOPRAM HYDROBROMIDE 40 MG PO TABS: 40.0000 mg | ORAL_TABLET | Freq: Every day | ORAL | 4 refills | Status: DC

## 2023-05-22 NOTE — Assessment & Plan Note (Signed)
Ongoing with ASCVD 2.1%, educated patient on diet changes and fish oil supplement.  She is aware of reasons for statin initiation.  She will send [facility] labs to provider for review.

## 2023-05-22 NOTE — Assessment & Plan Note (Signed)
Ongoing.  Noted on recent [facility] labs -- level 16, have recommended she continue Vitamin D3 2000 units daily and check labs outpatient -- she will send to PCP.

## 2023-05-22 NOTE — Assessment & Plan Note (Signed)
Chronic, stable with Celexa.  Denies SI/HI.  Continue current medication regimen and adjust as needed.  Refills up to date.  Return in 6 months. 

## 2023-05-22 NOTE — Assessment & Plan Note (Signed)
Chronic, stable with current Levothyroxine dosing. She works for [employer] and printed labs to be obtained there for thyroid check -- she will send these to PCP for review and we will adjust medication as needed.  Refills up to date.

## 2023-05-22 NOTE — Assessment & Plan Note (Signed)
BMI 38.27.  Recommended eating smaller high protein, low fat meals more frequently and exercising 30 mins a day 5 times a week with a goal of 10-15lb weight loss in the next 3 months. Patient voiced their understanding and motivation to adhere to these recommendations.  Discussed Wegovy and Zepbound, may consider adding this and look into coverage for it.

## 2023-05-22 NOTE — Assessment & Plan Note (Signed)
Last A1c 5.5%.  Continue diet focus and recheck via [facility] labs annually. °

## 2023-05-22 NOTE — Progress Notes (Signed)
BP 127/84   Pulse 98   Temp 98.2 F (36.8 C) (Oral)   Ht 5\' 5"  (1.651 m)   Wt 230 lb (104.3 kg)   SpO2 96%   BMI 38.27 kg/m    Subjective:    Patient ID: Rocky Crafts, female    DOB: February 03, 1966, 57 y.o.   MRN: [id number]  HPI: EMMAJANE ALTAMURA is a 57 y.o. female presenting on 05/22/2023 for comprehensive medical examination. Current medical complaints include: none  She currently lives with: significant other Menopausal Symptoms: yes -- almost made a full year without a cycle and then at month 11 had cycle again.  Last cycle was March.  Continues to have hot flashes.  Vitamin D level low on past labs and taking supplement.  Also taking B12 and multivitamin.  The 10-year ASCVD risk score (Arnett DK, et al., 2019) is: 2.1%*   Values used to calculate the score:     Age: 51 years     Sex: Female     Is Non-Hispanic African American: No     Diabetic: No     Tobacco smoker: No     Systolic Blood Pressure: 127 mmHg     Is BP treated: No     HDL Cholesterol: 56 mg/dL*     Total Cholesterol: 241 mg/dL*     * - Cholesterol units were assumed for this score calculation  HYPOTHYROIDISM Continues on Levothyroxine 75 MCG (has history of hyperthyroid with treatment which made her more hypo) with recent labs done at [facility], she will send to PCP.  Last A1c 5.5% with [facility], has been 5.9%.   Thyroid control status:controlled Satisfied with current treatment? yes Medication side effects: no Medication compliance: good compliance Etiology of hypothyroidism: none Recent dose adjustment:no Fatigue: occasional Cold intolerance: no Heat intolerance: no Weight gain: no Weight loss: no Constipation: no Diarrhea/loose stools: no Palpitations: no Lower extremity edema: no Anxiety/depressed mood: no   DEPRESSION Taking Celexa 40 MG daily for this and menopausal symptoms. Mood status: controlled Satisfied with current treatment?: yes Symptom severity: mild  Duration of current  treatment : chronic Side effects: no Medication compliance: good compliance Psychotherapy/counseling: none Previous psychiatric medications: Celexa Depressed mood: no Anxious mood: no Anhedonia: no Significant weight loss or gain: no Insomnia: none Fatigue: yes Feelings of worthlessness or guilt: no Impaired concentration/indecisiveness: no Suicidal ideations: no Hopelessness: no Crying spells: no    05/22/2023    1:53 PM 05/22/2023    1:38 PM 05/30/2022    1:43 PM 11/26/2021    9:58 AM 05/10/2021    9:55 AM  Depression screen PHQ 2/9  Decreased Interest 0 0 0 0 0  Down, Depressed, Hopeless 0 0 0 0 0  PHQ - 2 Score 0 0 0 0 0  Altered sleeping 1 1 2 3  0  Tired, decreased energy 2 2 3 3  0  Change in appetite 2 2 2  0 0  Feeling bad or failure about yourself  0 0 0 0 0  Trouble concentrating 0 0 0 3 0  Moving slowly or fidgety/restless 0 0 0 0 0  Suicidal thoughts 0 0 0 0 0  PHQ-9 Score 5 5 7 9  0  Difficult doing work/chores   Somewhat difficult         05/22/2023    1:53 PM 05/22/2023    1:38 PM 05/30/2022    1:44 PM 11/26/2021    9:58 AM  GAD 7 : Generalized Anxiety Score  Nervous,  Anxious, on Edge 1 1 0 0  Control/stop worrying 0 0 0 0  Worry too much - different things 0 0 0 0  Trouble relaxing 0 0 0 0  Restless 0 0 0 0  Easily annoyed or irritable 1 1 0 0  Afraid - awful might happen 0 0 0 0  Total GAD 7 Score 2 2 0 0  Anxiety Difficulty   Not difficult at all         05/10/2021    9:55 AM 11/26/2021   10:34 AM 05/23/2022    8:00 AM 05/22/2023    1:38 PM 05/22/2023    1:53 PM  Fall Risk  Falls in the past year? 1 0  0 0  Was there an injury with Fall? 0 0  0 0  Fall Risk Category Calculator 1 0  0 0  Fall Risk Category (Retired) Low Low     (RETIRED) Patient Fall Risk Level  Low fall risk Low fall risk    Patient at Risk for Falls Due to No Fall Risks No Fall Risks  No Fall Risks No Fall Risks  Fall risk Follow up Falls evaluation completed Falls evaluation  completed  Falls evaluation completed Falls evaluation completed    Functional Status Survey: Is the patient deaf or have difficulty hearing?: No Does the patient have difficulty seeing, even when wearing glasses/contacts?: No Does the patient have difficulty concentrating, remembering, or making decisions?: No Does the patient have difficulty walking or climbing stairs?: No Does the patient have difficulty dressing or bathing?: No Does the patient have difficulty doing errands alone such as visiting a doctor's office or shopping?: No    Past Medical History:  Past Medical History:  Diagnosis Date   Anxiety    Depression    Lumbago    Malaise and fatigue    Sciatica    Thyroid disease     Surgical History:  Past Surgical History:  Procedure Laterality Date   COLONOSCOPY WITH PROPOFOL N/A 08/04/2018   Procedure: COLONOSCOPY WITH PROPOFOL;  Surgeon: Pasty Spillers, MD;  Location: [facility];  Service: Endoscopy;  Laterality: N/A;   DILATION AND CURETTAGE OF UTERUS     TUBAL LIGATION      Medications:  Current Outpatient Medications on File Prior to Visit  Medication Sig   Levocetirizine Dihydrochloride (XYZAL PO) Take by mouth daily.   levothyroxine (SYNTHROID) 75 MCG tablet Take 1 tablet (75 mcg total) by mouth daily.   TURMERIC PO Take by mouth daily.   No current facility-administered medications on file prior to visit.    Allergies:  No Known Allergies  Social History:  Social History   Socioeconomic History   Marital status: Single    Spouse name: Not on file   Number of children: Not on file   Years of education: Not on file   Highest education level: Not on file  Occupational History   Not on file  Tobacco Use   Smoking status: Never   Smokeless tobacco: Never  Vaping Use   Vaping Use: Never used  Substance and Sexual Activity   Alcohol use: Yes    Alcohol/week: 0.0 standard drinks of alcohol    Comment: rarely   Drug use: No   Sexual  activity: Yes  Other Topics Concern   Not on file  Social History Narrative   Not on file   Social Determinants of Health   Financial Resource Strain: Not on file  Food Insecurity: Not  on file  Transportation Needs: Not on file  Physical Activity: Not on file  Stress: Not on file  Social Connections: Not on file  Intimate Partner Violence: Not on file   Social History   Tobacco Use  Smoking Status Never  Smokeless Tobacco Never   Social History   Substance and Sexual Activity  Alcohol Use Yes   Alcohol/week: 0.0 standard drinks of alcohol   Comment: rarely    Family History:  Family History  Problem Relation Age of Onset   Cancer Mother        liver age 78's   Lung disease Mother    Cancer Father        non hodgkins lymphoma   Hypertension Father    Diabetes Brother    Diabetes Maternal Grandmother    Cancer Maternal Grandfather        lung   Diabetes Paternal Grandmother     Past medical history, surgical history, medications, allergies, family history and social history reviewed with patient today and changes made to appropriate areas of the chart.   ROS All other ROS negative except what is listed above and in the HPI.      Objective:    BP 127/84   Pulse 98   Temp 98.2 F (36.8 C) (Oral)   Ht 5\' 5"  (1.651 m)   Wt 230 lb (104.3 kg)   SpO2 96%   BMI 38.27 kg/m   Wt Readings from Last 3 Encounters:  05/22/23 230 lb (104.3 kg)  05/30/22 226 lb (102.5 kg)  05/23/22 220 lb (99.8 kg)    Physical Exam Vitals and nursing note reviewed. Exam conducted with a chaperone present.  Constitutional:      General: She is awake. She is not in acute distress.    Appearance: She is well-developed and well-groomed. She is obese. She is not ill-appearing or toxic-appearing.  HENT:     Head: Normocephalic and atraumatic.     Right Ear: Hearing, tympanic membrane, ear canal and external ear normal. No drainage.     Left Ear: Hearing, tympanic membrane, ear  canal and external ear normal. No drainage.     Nose: Nose normal.     Right Sinus: No maxillary sinus tenderness or frontal sinus tenderness.     Left Sinus: No maxillary sinus tenderness or frontal sinus tenderness.     Mouth/Throat:     Mouth: Mucous membranes are moist.     Pharynx: Oropharynx is clear. Uvula midline. No pharyngeal swelling, oropharyngeal exudate or posterior oropharyngeal erythema.  Eyes:     General: Lids are normal.        Right eye: No discharge.        Left eye: No discharge.     Extraocular Movements: Extraocular movements intact.     Conjunctiva/sclera: Conjunctivae normal.     Pupils: Pupils are equal, round, and reactive to light.     Visual Fields: Right eye visual fields normal and left eye visual fields normal.  Neck:     Thyroid: No thyromegaly.     Vascular: No carotid bruit.     Trachea: Trachea normal.  Cardiovascular:     Rate and Rhythm: Normal rate and regular rhythm.     Heart sounds: Normal heart sounds. No murmur heard.    No gallop.  Pulmonary:     Effort: Pulmonary effort is normal. No accessory muscle usage or respiratory distress.     Breath sounds: Normal breath sounds.  Abdominal:  General: Bowel sounds are normal.     Palpations: Abdomen is soft. There is no hepatomegaly or splenomegaly.     Tenderness: There is no abdominal tenderness.     Hernia: There is no hernia in the left inguinal area or right inguinal area.  Genitourinary:    Exam position: Lithotomy position.     Labia:        Right: No rash.        Left: No rash.      Vagina: Normal.     Cervix: Normal.     Uterus: Normal.      Adnexa: Right adnexa normal and left adnexa normal.  Musculoskeletal:        General: Normal range of motion.     Cervical back: Normal range of motion and neck supple.     Right lower leg: No edema.     Left lower leg: No edema.  Lymphadenopathy:     Head:     Right side of head: No submental, submandibular, tonsillar, preauricular  or posterior auricular adenopathy.     Left side of head: No submental, submandibular, tonsillar, preauricular or posterior auricular adenopathy.     Cervical: No cervical adenopathy.  Skin:    General: Skin is warm and dry.     Capillary Refill: Capillary refill takes less than 2 seconds.     Findings: No rash.  Neurological:     Mental Status: She is alert and oriented to person, place, and time.     Gait: Gait is intact.     Deep Tendon Reflexes: Reflexes are normal and symmetric.     Reflex Scores:      Brachioradialis reflexes are 2+ on the right side and 2+ on the left side.      Patellar reflexes are 2+ on the right side and 2+ on the left side. Psychiatric:        Attention and Perception: Attention normal.        Mood and Affect: Mood normal.        Speech: Speech normal.        Behavior: Behavior normal. Behavior is cooperative.        Thought Content: Thought content normal.        Judgment: Judgment normal.    Results for orders placed or performed during the hospital encounter of 05/23/22  Urinalysis, Routine w reflex microscopic Urine, Clean Catch  Result Value Ref Range   Color, Urine YELLOW YELLOW   APPearance CLEAR CLEAR   Specific Gravity, Urine 1.015 1.005 - 1.030   pH 7.5 5.0 - 8.0   Glucose, UA NEGATIVE NEGATIVE mg/dL   Hgb urine dipstick NEGATIVE NEGATIVE   Bilirubin Urine NEGATIVE NEGATIVE   Ketones, ur NEGATIVE NEGATIVE mg/dL   Protein, ur NEGATIVE NEGATIVE mg/dL   Nitrite NEGATIVE NEGATIVE   Leukocytes,Ua NEGATIVE NEGATIVE  Pregnancy, urine  Result Value Ref Range   Preg Test, Ur NEGATIVE NEGATIVE      Assessment & Plan:   Problem List Items Addressed This Visit       Endocrine   Hypothyroidism    Chronic, stable with current Levothyroxine dosing. She works for [employer] and printed labs to be obtained there for thyroid check -- she will send these to PCP for review and we will adjust medication as needed.  Refills up to date.      Relevant  Orders   TSH   T4, free     Other   Hyperlipidemia  Ongoing with ASCVD 2.1%, educated patient on diet changes and fish oil supplement.  She is aware of reasons for statin initiation.  She will send [facility] labs to provider for review.      Relevant Orders   Lipid panel   Obesity    BMI 38.27.  Recommended eating smaller high protein, low fat meals more frequently and exercising 30 mins a day 5 times a week with a goal of 10-15lb weight loss in the next 3 months. Patient voiced their understanding and motivation to adhere to these recommendations.  Discussed Wegovy and Zepbound, may consider adding this and look into coverage for it.       Pre-diabetes    Last A1c 5.5%.  Continue diet focus and recheck via [facility] labs annually.      Relevant Orders   HgB A1c   Severe depression (HCC) - Primary    Chronic, stable with Celexa.  Denies SI/HI.  Continue current medication regimen and adjust as needed.  Refills up to date.  Return in 6 months.      Relevant Medications   citalopram (CELEXA) 40 MG tablet   Vitamin D deficiency    Ongoing.  Noted on recent [facility] labs -- level 16, have recommended she continue Vitamin D3 2000 units daily and check labs outpatient -- she will send to PCP.      Relevant Orders   Vitamin D (25 hydroxy)   Other Visit Diagnoses     Cervical cancer screening       Pap performed in office today and sent to lab.   Relevant Orders   Cytology - PAP   Encounter for annual physical exam       Annual physical today and health maintenance reviewed with patient.   Relevant Orders   CBC with Differential/Platelet   COMPLETE METABOLIC PANEL WITH GFR        Follow up plan: Return in about 1 year (around 05/21/2024) for Annual physical.   LABORATORY TESTING:  - Pap smear: Performed today  IMMUNIZATIONS:   - Tdap: Tetanus vaccination status reviewed: last tetanus booster within 10 years. - Influenza: Up to date - Pneumovax: Not applicable - Prevnar: Not  applicable - COVID: Up to date - HPV: Not applicable - Shingrix vaccine: will get at pharmacy  SCREENING: -Mammogram: Up to date  - Colonoscopy: Up to date  - Bone Density: Not applicable  -Hearing Test: Not applicable  -Spirometry: Not applicable   PATIENT COUNSELING:   Advised to take 1 mg of folate supplement per day if capable of pregnancy.   Sexuality: Discussed sexually transmitted diseases, partner selection, use of condoms, avoidance of unintended pregnancy  and contraceptive alternatives.   Advised to avoid cigarette smoking.  I discussed with the patient that most people either abstain from alcohol or drink within safe limits (<=14/week and <=4 drinks/occasion for males, <=7/weeks and <= 3 drinks/occasion for females) and that the risk for alcohol disorders and other health effects rises proportionally with the number of drinks per week and how often a drinker exceeds daily limits.  Discussed cessation/primary prevention of drug use and availability of treatment for abuse.   Diet: Encouraged to adjust caloric intake to maintain  or achieve ideal body weight, to reduce intake of dietary saturated fat and total fat, to limit sodium intake by avoiding high sodium foods and not adding table salt, and to maintain adequate dietary potassium and calcium preferably from fresh fruits, vegetables, and low-fat dairy products.  Stressed the importance of regular exercise  Injury prevention: Discussed safety belts, safety helmets, smoke detector, smoking near bedding or upholstery.   Dental health: Discussed importance of regular tooth brushing, flossing, and dental visits.    NEXT PREVENTATIVE PHYSICAL DUE IN 1 YEAR. Return in about 1 year (around 05/21/2024) for Annual physical.

## 2023-05-27 LAB — CYTOLOGY - PAP
Adequacy: ABSENT
Comment: NEGATIVE
Diagnosis: NEGATIVE
High risk HPV: NEGATIVE

## 2023-05-27 NOTE — Progress Notes (Signed)
Hi Averly.  Your Pap was normal.  We will repeat it in 5 years.

## 2023-07-20 ENCOUNTER — Other Ambulatory Visit: Payer: Self-pay | Admitting: Nurse Practitioner

## 2023-07-20 DIAGNOSIS — Z1211 Encounter for screening for malignant neoplasm of colon: Secondary | ICD-10-CM

## 2023-07-29 ENCOUNTER — Encounter: Payer: Self-pay | Admitting: *Deleted

## 2023-08-10 ENCOUNTER — Encounter: Payer: Self-pay | Admitting: Nurse Practitioner

## 2023-08-10 ENCOUNTER — Ambulatory Visit
Admission: RE | Admit: 2023-08-10 | Discharge: 2023-08-10 | Disposition: A | Discharge status: Home / Self Care | Payer: 59 | Attending: Nurse Practitioner | Admitting: Nurse Practitioner

## 2023-08-10 ENCOUNTER — Ambulatory Visit: Payer: Self-pay

## 2023-08-10 ENCOUNTER — Ambulatory Visit (INDEPENDENT_AMBULATORY_CARE_PROVIDER_SITE_OTHER): Payer: 59 | Admitting: Nurse Practitioner

## 2023-08-10 ENCOUNTER — Ambulatory Visit
Admission: RE | Admit: 2023-08-10 | Discharge: 2023-08-10 | Disposition: A | Discharge status: Home / Self Care | Payer: 59 | Source: Ambulatory Visit | Attending: Nurse Practitioner | Admitting: Nurse Practitioner

## 2023-08-10 VITALS — BP 132/86 | HR 79 | Temp 98.4°F | Ht 65.0 in | Wt 230.2 lb

## 2023-08-10 DIAGNOSIS — M545 Low back pain, unspecified: Secondary | ICD-10-CM

## 2023-08-10 LAB — URINALYSIS, ROUTINE W REFLEX MICROSCOPIC
Bilirubin, UA: NEGATIVE
Glucose, UA: NEGATIVE
Ketones, UA: NEGATIVE
Leukocytes,UA: NEGATIVE
Nitrite, UA: NEGATIVE
Protein,UA: NEGATIVE
RBC, UA: NEGATIVE
Specific Gravity, UA: 1.015 (ref 1.005–1.030)
Urobilinogen, Ur: 0.2 mg/dL (ref 0.2–1.0)
pH, UA: 7 (ref 5.0–7.5)

## 2023-08-10 MED ORDER — CYCLOBENZAPRINE HCL 10 MG PO TABS: 10.0000 mg | ORAL_TABLET | Freq: Three times a day (TID) | ORAL | 0 refills | Status: DC | PRN

## 2023-08-10 MED ORDER — KETOROLAC TROMETHAMINE 60 MG/2ML IM SOLN
30.0000 mg | Freq: Once | INTRAMUSCULAR | Status: AC
Start: 2023-08-10 — End: 2023-08-10
  Administered 2023-08-10: 30 mg via INTRAMUSCULAR

## 2023-08-10 MED ORDER — PREDNISONE 10 MG PO TABS: ORAL_TABLET | ORAL | 0 refills | Status: DC

## 2023-08-10 NOTE — Patient Instructions (Signed)

## 2023-08-10 NOTE — Telephone Encounter (Signed)
  Chief Complaint: Severe back pain - more on right side when touched Symptoms: above Frequency: yesterday afternoon Pertinent Negatives: Patient denies neurological  Disposition: [] ED /[] Urgent Care (no appt availability in office) / [] Appointment(In office/virtual)/ []  [facility]/ [] Home Care/ [] Refused Recommended Disposition /[] [facility]/ []  Follow-up with PCP Additional Notes: Pt states that upon waking form a nap she has severe back pain. Pt would like to be worked in today. She does not want to go to UC. Please advise ASAP    Reason for Disposition  [1] SEVERE back pain (e.g., excruciating, unable to do any normal activities) AND [2] not improved 2 hours after pain medicine  Answer Assessment - Initial Assessment Questions 1. ONSET: "When did the pain begin?"      Yesterday afternoon 2. LOCATION: "Where does it hurt?" (upper, mid or lower back)     Lower back 3. SEVERITY: "How bad is the pain?"  (e.g., Scale 1-10; mild, moderate, or severe)   - MILD (1-3): Doesn't interfere with normal activities.    - MODERATE (4-7): Interferes with normal activities or awakens from sleep.    - SEVERE (8-10): Excruciating pain, unable to do any normal activities.      Mild - when she gets up gets very bad 4. PATTERN: "Is the pain constant?" (e.g., yes, no; constant, intermittent)      Constant - worse st times 5. RADIATION: "Does the pain shoot into your legs or somewhere else?"     no 6. CAUSE:  "What do you think is causing the back pain?"      Unsure - woke up after nap with pain 7. BACK OVERUSE:  "Any recent lifting of heavy objects, strenuous work or exercise?"     no 8. MEDICINES: "What have you taken so far for the pain?" (e.g., nothing, acetaminophen, NSAIDS)     IUB - icy hot voltaren 9. NEUROLOGIC SYMPTOMS: "Do you have any weakness, numbness, or problems with bowel/bladder control?"     no  Protocols used: Back Pain-A-AH

## 2023-08-10 NOTE — Progress Notes (Signed)
Acute Office Visit  Subjective:     Patient ID: Amy Gilbert, female    DOB: 13-Aug-1966, 57 y.o.   MRN: [id number]  Chief Complaint  Patient presents with   Back Pain    Pt states she woke up from a nap yesterday with the R side of her lower back hurting. States she feels the pain more with walking, rates pain at a 10 when walking and 2 when sitting. States she has used [facility], and taken Ibuprofen as well. States none of these have really helped.     Presents today for back pain.  Started yesterday afternoon, woke-up from nap with this.  Prior to nap she was cleaning out closet, nothing vigorous.  No recent injuries or falls.  Has had pain like this before in small doses, but not like current pain.  Pain is constant, but sitting makes better and walking makes worse.  Sitting down it is an ache and standing up is more shooting pain across back.  Standing 10/10 and sitting 2/10.  Lifting up right leg relieves pain.  Back Pain This is a new problem. The current episode started yesterday. The problem occurs constantly. The problem is unchanged. The pain is present in the lumbar spine. The quality of the pain is described as aching and shooting. The pain is at a severity of 10/10. The pain is moderate. The pain is The same all the time. The symptoms are aggravated by standing, bending and twisting. Stiffness is present All day. Pertinent negatives include no abdominal pain, bladder incontinence, bowel incontinence, chest pain, dysuria, fever, leg pain, numbness, paresis, pelvic pain, perianal numbness, tingling, weakness or weight loss. Risk factors include obesity, lack of exercise and sedentary lifestyle. She has tried analgesics, bed rest, heat and ice for the symptoms. The treatment provided mild relief.   Patient is in today for back pain.  Review of Systems  Constitutional:  Negative for chills, fever, malaise/fatigue and weight loss.  Respiratory:  Negative for cough,  shortness of breath and wheezing.   Cardiovascular:  Negative for chest pain, palpitations and leg swelling.  Gastrointestinal:  Negative for abdominal pain and bowel incontinence.  Genitourinary:  Negative for bladder incontinence, dysuria, frequency, hematuria, pelvic pain and urgency.  Musculoskeletal:  Positive for back pain.  Neurological:  Negative for tingling, weakness and numbness.  Psychiatric/Behavioral: Negative.        Objective:    BP 132/86 (BP Location: Left Arm, Patient Position: Sitting)   Pulse 79   Temp 98.4 F (36.9 C) (Oral)   Ht 5\' 5"  (1.651 m)   Wt 230 lb 3.2 oz (104.4 kg)   SpO2 98%   BMI 38.31 kg/m  BP Readings from Last 3 Encounters:  08/10/23 132/86  05/22/23 127/84  05/30/22 125/77   Wt Readings from Last 3 Encounters:  08/10/23 230 lb 3.2 oz (104.4 kg)  05/22/23 230 lb (104.3 kg)  05/30/22 226 lb (102.5 kg)   Physical Exam Vitals and nursing note reviewed.  Constitutional:      General: She is awake. She is not in acute distress.    Appearance: She is well-developed and well-groomed. She is not ill-appearing or toxic-appearing.  HENT:     Head: Normocephalic.     Right Ear: Hearing and external ear normal.     Left Ear: Hearing and external ear normal.  Eyes:     General: Lids are normal.        Right eye: No  discharge.        Left eye: No discharge.     Conjunctiva/sclera: Conjunctivae normal.     Pupils: Pupils are equal, round, and reactive to light.  Neck:     Vascular: No carotid bruit.  Cardiovascular:     Rate and Rhythm: Normal rate and regular rhythm.     Heart sounds: Normal heart sounds. No murmur heard.    No gallop.  Pulmonary:     Effort: Pulmonary effort is normal. No accessory muscle usage or respiratory distress.     Breath sounds: Normal breath sounds.  Abdominal:     General: Bowel sounds are normal. There is no distension.     Palpations: Abdomen is soft.     Tenderness: There is no abdominal tenderness.   Musculoskeletal:     Cervical back: Normal range of motion and neck supple.     Lumbar back: Spasms and tenderness (to right lower back) present. No swelling or bony tenderness. Decreased range of motion (flexion and extension). Negative right straight leg raise test and negative left straight leg raise test.     Right lower leg: No edema.     Left lower leg: No edema.     Comments: No rashes noted.  Lymphadenopathy:     Cervical: No cervical adenopathy.  Skin:    General: Skin is warm and dry.  Neurological:     Mental Status: She is alert and oriented to person, place, and time.     Deep Tendon Reflexes: Reflexes are normal and symmetric.     Reflex Scores:      Brachioradialis reflexes are 2+ on the right side and 2+ on the left side.      Patellar reflexes are 2+ on the right side and 2+ on the left side. Psychiatric:        Attention and Perception: Attention normal.        Mood and Affect: Mood normal.        Speech: Speech normal.        Behavior: Behavior normal. Behavior is cooperative.        Thought Content: Thought content normal.    No results found for any visits on 08/10/23.     Assessment & Plan:   Problem List Items Addressed This Visit       Other   Acute right-sided low back pain without sciatica - Primary    Acute with history of similar, but this is worse.  Will obtain imaging since history of similar, only this is worsening.  No red flags in office today.  Toradol shot in office.  Start Prednisone taper and Flexeril as needed.  Recommend continue at home regimen.  Return in one week.      Relevant Medications   predniSONE (DELTASONE) 10 MG tablet   cyclobenzaprine (FLEXERIL) 10 MG tablet   Other Relevant Orders   Urinalysis, Routine w reflex microscopic   DG Lumbar Spine Complete   Meds ordered this encounter  Medications   predniSONE (DELTASONE) 10 MG tablet    Sig: Take 6 tablets by mouth daily for 2 days, then reduce by 1 tablet every 2 days  until gone    Dispense:  42 tablet    Refill:  0   ketorolac (TORADOL) injection 30 mg   cyclobenzaprine (FLEXERIL) 10 MG tablet    Sig: Take 1 tablet (10 mg total) by mouth 3 (three) times daily as needed for muscle spasms.    Dispense:  45  tablet    Refill:  0    Return in about 1 week (around 08/17/2023) for BACK PAiN.  Marjie Skiff, NP

## 2023-08-10 NOTE — Assessment & Plan Note (Signed)
Acute with history of similar, but this is worse.  Will obtain imaging since history of similar, only this is worsening.  No red flags in office today.  Toradol shot in office.  Start Prednisone taper and Flexeril as needed.  Recommend continue at home regimen.  Return in one week.

## 2023-08-10 NOTE — Telephone Encounter (Signed)
Called and scheduled patient on 08/10/2023 @ 9:40 am.

## 2023-08-19 ENCOUNTER — Ambulatory Visit: Payer: 59 | Admitting: Nurse Practitioner

## 2023-08-20 NOTE — Progress Notes (Signed)
Contacted via MyChart   Good afternoon Amy Gilbert, your imaging returned and overall is stable.  Great news!!

## 2023-08-22 NOTE — Patient Instructions (Signed)
Acute Back Pain, Adult Acute back pain is sudden and usually short-lived. It is often caused by an injury to the muscles and tissues in the back. The injury may result from: A muscle, tendon, or ligament getting overstretched or torn. Ligaments are tissues that connect bones to each other. Lifting something improperly can cause a back strain. Wear and tear (degeneration) of the spinal disks. Spinal disks are circular tissue that provide cushioning between the bones of the spine (vertebrae). Twisting motions, such as while playing sports or doing yard work. A hit to the back. Arthritis. You may have a physical exam, lab tests, and imaging tests to find the cause of your pain. Acute back pain usually goes away with rest and home care. Follow these instructions at home: Managing pain, stiffness, and swelling Take over-the-counter and prescription medicines only as told by your health care provider. Treatment may include medicines for pain and inflammation that are taken by mouth or applied to the skin, or muscle relaxants. Your health care provider may recommend applying ice during the first 24-48 hours after your pain starts. To do this: Put ice in a plastic bag. Place a towel between your skin and the bag. Leave the ice on for 20 minutes, 2-3 times a day. Remove the ice if your skin turns bright red. This is very important. If you cannot feel pain, heat, or cold, you have a greater risk of damage to the area. If directed, apply heat to the affected area as often as told by your health care provider. Use the heat source that your health care provider recommends, such as a moist heat pack or a heating pad. Place a towel between your skin and the heat source. Leave the heat on for 20-30 minutes. Remove the heat if your skin turns bright red. This is especially important if you are unable to feel pain, heat, or cold. You have a greater risk of getting burned. Activity  Do not stay in bed. Staying in  bed for more than 1-2 days can delay your recovery. Sit up and stand up straight. Avoid leaning forward when you sit or hunching over when you stand. If you work at a desk, sit close to it so you do not need to lean over. Keep your chin tucked in. Keep your neck drawn back, and keep your elbows bent at a 90-degree angle (right angle). Sit high and close to the steering wheel when you drive. Add lower back (lumbar) support to your car seat, if needed. Take short walks on even surfaces as soon as you are able. Try to increase the length of time you walk each day. Do not sit, drive, or stand in one place for more than 30 minutes at a time. Sitting or standing for long periods of time can put stress on your back. Do not drive or use heavy machinery while taking prescription pain medicine. Use proper lifting techniques. When you bend and lift, use positions that put less stress on your back: Bend your knees. Keep the load close to your body. Avoid twisting. Exercise regularly as told by your health care provider. Exercising helps your back heal faster and helps prevent back injuries by keeping muscles strong and flexible. Work with a physical therapist to make a safe exercise program, as recommended by your health care provider. Do any exercises as told by your physical therapist. Lifestyle Maintain a healthy weight. Extra weight puts stress on your back and makes it difficult to have good   posture. Avoid activities or situations that make you feel anxious or stressed. Stress and anxiety increase muscle tension and can make back pain worse. Learn ways to manage anxiety and stress, such as through exercise. General instructions Sleep on a firm mattress in a comfortable position. Try lying on your side with your knees slightly bent. If you lie on your back, put a pillow under your knees. Keep your head and neck in a straight line with your spine (neutral position) when using electronic equipment like  smartphones or pads. To do this: Raise your smartphone or pad to look at it instead of bending your head or neck to look down. Put the smartphone or pad at the level of your face while looking at the screen. Follow your treatment plan as told by your health care provider. This may include: Cognitive or behavioral therapy. Acupuncture or massage therapy. Meditation or yoga. Contact a health care provider if: You have pain that is not relieved with rest or medicine. You have increasing pain going down into your legs or buttocks. Your pain does not improve after 2 weeks. You have pain at night. You lose weight without trying. You have a fever or chills. You develop nausea or vomiting. You develop abdominal pain. Get help right away if: You develop new bowel or bladder control problems. You have unusual weakness or numbness in your arms or legs. You feel faint. These symptoms may represent a serious problem that is an emergency. Do not wait to see if the symptoms will go away. Get medical help right away. Call your local emergency services (911 in the [place] Do not drive yourself to the hospital. Summary Acute back pain is sudden and usually short-lived. Use proper lifting techniques. When you bend and lift, use positions that put less stress on your back. Take over-the-counter and prescription medicines only as told by your health care provider, and apply heat or ice as told. This information is not intended to replace advice given to you by your health care provider. Make sure you discuss any questions you have with your health care provider. Document Revised: 02/08/2021 Document Reviewed: 02/08/2021 Elsevier Patient Education  2024 [employer].  

## 2023-08-26 ENCOUNTER — Encounter: Payer: Self-pay | Admitting: Nurse Practitioner

## 2023-08-26 ENCOUNTER — Ambulatory Visit (INDEPENDENT_AMBULATORY_CARE_PROVIDER_SITE_OTHER): Payer: 59 | Admitting: Nurse Practitioner

## 2023-08-26 VITALS — BP 129/85 | HR 109 | Temp 97.8°F | Ht 65.0 in

## 2023-08-26 DIAGNOSIS — M545 Low back pain, unspecified: Secondary | ICD-10-CM

## 2023-08-26 NOTE — Progress Notes (Signed)
BP 129/85   Pulse (!) 109   Temp 97.8 F (36.6 C) (Oral)   Ht 5\' 5"  (1.651 m)   SpO2 97%   BMI 38.31 kg/m    Subjective:    Patient ID: Rocky Crafts, female    DOB: 1966/04/19, 57 y.o.   MRN: [id number]  HPI: CHIA DAHLMAN is a 57 y.o. female  Chief Complaint  Patient presents with   Back Pain    Patient says she is doing much better and feeling fine.    BACK PAIN Back pain follow-up today.  Was seen 08/10/23 for pain and started on Prednisone taper + Flexeril & given Toradol in clinic.  Feeling much better -- completed all Prednisone and has some Flexeril left.  No further pain. Duration: weeks Mechanism of injury: no trauma Alleviating factors: Prednisone and muscle relaxer Status: better Treatments attempted: as above  Relief with NSAIDs?: No NSAIDs Taken Nighttime pain:  no Paresthesias / decreased sensation:  no Bowel / bladder incontinence:  no Fevers:  no Dysuria / urinary frequency:  no   Relevant past medical, surgical, family and social history reviewed and updated as indicated. Interim medical history since our last visit reviewed. Allergies and medications reviewed and updated.  Review of Systems  Constitutional:  Negative for activity change, appetite change, diaphoresis, fatigue and fever.  Respiratory:  Negative for cough, chest tightness and shortness of breath.   Cardiovascular:  Negative for chest pain, palpitations and leg swelling.  Musculoskeletal:  Negative for back pain.  Neurological: Negative.   Psychiatric/Behavioral: Negative.     Per HPI unless specifically indicated above     Objective:    BP 129/85   Pulse (!) 109   Temp 97.8 F (36.6 C) (Oral)   Ht 5\' 5"  (1.651 m)   SpO2 97%   BMI 38.31 kg/m   Wt Readings from Last 3 Encounters:  08/10/23 230 lb 3.2 oz (104.4 kg)  05/22/23 230 lb (104.3 kg)  05/30/22 226 lb (102.5 kg)    Physical Exam Vitals and nursing note reviewed.  Constitutional:      General: She is  awake. She is not in acute distress.    Appearance: She is well-developed and well-groomed. She is not ill-appearing or toxic-appearing.  HENT:     Head: Normocephalic.     Right Ear: Hearing and external ear normal.     Left Ear: Hearing and external ear normal.  Eyes:     General: Lids are normal.        Right eye: No discharge.        Left eye: No discharge.     Conjunctiva/sclera: Conjunctivae normal.     Pupils: Pupils are equal, round, and reactive to light.  Neck:     Vascular: No carotid bruit.  Cardiovascular:     Rate and Rhythm: Normal rate and regular rhythm.     Heart sounds: Normal heart sounds. No murmur heard.    No gallop.  Pulmonary:     Effort: Pulmonary effort is normal. No accessory muscle usage or respiratory distress.     Breath sounds: Normal breath sounds.  Abdominal:     General: Bowel sounds are normal. There is no distension.     Palpations: Abdomen is soft.     Tenderness: There is no abdominal tenderness.  Musculoskeletal:     Cervical back: Normal range of motion and neck supple.     Lumbar back: Normal.     Right lower  leg: No edema.     Left lower leg: No edema.  Lymphadenopathy:     Cervical: No cervical adenopathy.  Skin:    General: Skin is warm and dry.  Neurological:     Mental Status: She is alert and oriented to person, place, and time.     Deep Tendon Reflexes: Reflexes are normal and symmetric.     Reflex Scores:      Brachioradialis reflexes are 2+ on the right side and 2+ on the left side.      Patellar reflexes are 2+ on the right side and 2+ on the left side. Psychiatric:        Attention and Perception: Attention normal.        Mood and Affect: Mood normal.        Speech: Speech normal.        Behavior: Behavior normal. Behavior is cooperative.        Thought Content: Thought content normal.     Results for orders placed or performed in visit on 08/10/23  Urinalysis, Routine w reflex microscopic  Result Value Ref Range    Specific Gravity, UA 1.015 1.005 - 1.030   pH, UA 7.0 5.0 - 7.5   Color, UA Yellow Yellow   Appearance Ur Clear Clear   Leukocytes,UA Negative Negative   Protein,UA Negative Negative/Trace   Glucose, UA Negative Negative   Ketones, UA Negative Negative   RBC, UA Negative Negative   Bilirubin, UA Negative Negative   Urobilinogen, Ur 0.2 0.2 - 1.0 mg/dL   Nitrite, UA Negative Negative   Microscopic Examination Comment       Assessment & Plan:   Problem List Items Addressed This Visit       Other   Acute right-sided low back pain without sciatica - Primary    Acute and improved.  Overall exam with improvement in ROM and no pain.  Return as needed or scheduled.        Follow up plan: Return for as scheduled.

## 2023-08-26 NOTE — Assessment & Plan Note (Signed)
Acute and improved.  Overall exam with improvement in ROM and no pain.  Return as needed or scheduled.

## 2023-08-31 ENCOUNTER — Ambulatory Visit: Payer: Self-pay | Admitting: *Deleted

## 2023-08-31 NOTE — Telephone Encounter (Signed)
Summary:  Per agent: "Swelling in face and sinus pain/pressure   Right side sinus pain and pressure, unable to blow her nose, cough, cheek red/heated, and swelling on her right side. Scheduled for tomorrow with PCP"         Chief Complaint: Sinus Pain Symptoms: Right sided facial pain, mild swelling right side,red with warmth. Cough, "White with green tint."  Severe pain "When blowing nose." Nose stuffy Frequency: Wednesday, worsening yesterday am Pertinent Negatives: Patient denies  Disposition: [] ED /[] Urgent Care (no appt availability in office) / [] Appointment(In office/virtual)/ []  [facility]/ [] Home Care/ [x] Refused Recommended Disposition /[] [facility]/ []  Follow-up with PCP Additional Notes:  Appt secured for tomorrow 4:20 by agent prior to triage. Advised OV today with Erin, pt declined "Can't leave work." Any possibility pt can be seen tomorrow AM, "Earliest appointment?"  Care advise provided, advised UC for worsening symptoms. Verbalizes understanding.   Reason for Disposition  [1] Redness or swelling on the cheek, forehead or around the eye AND [2] no fever  Answer Assessment - Initial Assessment Questions 1. LOCATION: "Where does it hurt?"      Right sided facial pain 2. ONSET: "When did the sinus pain start?"  (e.g., hours, days)      Wednesday 3. SEVERITY: "How bad is the pain?"   (Scale 1-10; mild, moderate or severe)   - MILD (1-3): doesn't interfere with normal activities    - MODERATE (4-7): interferes with normal activities (e.g., work or school) or awakens from sleep   - SEVERE (8-10): excruciating pain and patient unable to do any normal activities        When blowing nose, severe 4. RECURRENT SYMPTOM: "Have you ever had sinus problems before?" If Yes, ask: "When was the last time?" and "What happened that time?"      Yes 5. NASAL CONGESTION: "Is the nose blocked?" If Yes, ask: "Can you open it or must you breathe through your mouth?"      Yes at times 6. NASAL DISCHARGE: "Do you have discharge from your nose?" If so ask, "What color?"     White with geen 7. FEVER: "Do you have a fever?" If Yes, ask: "What is it, how was it measured, and when did it start?"      Sweating a lot 8. OTHER SYMPTOMS: "Do you have any other symptoms?" (e.g., sore throat, cough, earache, difficulty breathing)     Cough  Protocols used: Sinus Pain or Congestion-A-AH

## 2023-09-01 ENCOUNTER — Ambulatory Visit: Payer: 59 | Admitting: Nurse Practitioner

## 2023-09-01 ENCOUNTER — Encounter: Payer: Self-pay | Admitting: Nurse Practitioner

## 2023-09-01 VITALS — BP 144/84 | HR 98 | Temp 98.0°F | Wt 233.8 lb

## 2023-09-01 DIAGNOSIS — J321 Chronic frontal sinusitis: Secondary | ICD-10-CM | POA: Insufficient documentation

## 2023-09-01 DIAGNOSIS — J0110 Acute frontal sinusitis, unspecified: Secondary | ICD-10-CM

## 2023-09-01 MED ORDER — AZITHROMYCIN 250 MG PO TABS: ORAL_TABLET | ORAL | 0 refills | Status: AC

## 2023-09-01 NOTE — Telephone Encounter (Signed)
Scheduled for this afternoon

## 2023-09-01 NOTE — Progress Notes (Signed)
BP (!) 144/84 (BP Location: Left Arm, Patient Position: Sitting) Comment: took Sudafed  Pulse 98   Temp 98 F (36.7 C) (Oral)   Wt 233 lb 12.8 oz (106.1 kg)   SpO2 97%   BMI 38.91 kg/m    Subjective:    Patient ID: Amy Gilbert, female    DOB: October 15, 1966, 57 y.o.   MRN: [id number]  HPI: ATLANTA PELTO is a 57 y.o. female  Chief Complaint  Patient presents with   Facial Pain    Pt states she has had a cold last Thursday and believes it has turned into a sinus infection    UPPER RESPIRATORY TRACT INFECTION Started about one week ago with URI symptoms and has progressed into sinus infection.  No Covid testing at home. Fever: no Cough: yes Shortness of breath: no Wheezing: no Chest pain: no Chest tightness: no Chest congestion: no Nasal congestion: yes Runny nose: yes Post nasal drip: yes Sneezing: no Sore throat: no Swollen glands: no Sinus pressure: yes on right side Headache: yes Face pain: yes right side Toothache: no Ear pain: no Ear pressure: yes bilateral Eyes red/itching:no Eye drainage/crusting: no  Vomiting: no Rash: no Fatigue: yes Sick contacts: yes Strep contacts: no  Context: worse Recurrent sinusitis: no Relief with OTC cold/cough medications: yes  Treatments attempted: Sudafed today, Mucinex  Relevant past medical, surgical, family and social history reviewed and updated as indicated. Interim medical history since our last visit reviewed. Allergies and medications reviewed and updated.  Review of Systems  Constitutional:  Positive for fatigue. Negative for activity change, appetite change, chills and fever.  HENT:  Positive for congestion, postnasal drip, rhinorrhea, sinus pressure and sinus pain. Negative for ear discharge, ear pain, facial swelling, sneezing, sore throat and voice change.   Eyes:  Negative for pain and visual disturbance.  Respiratory:  Positive for cough and chest tightness. Negative for shortness of breath and  wheezing.   Cardiovascular:  Negative for chest pain, palpitations and leg swelling.  Gastrointestinal: Negative.   Neurological:  Positive for headaches. Negative for dizziness and numbness.  Psychiatric/Behavioral: Negative.     Per HPI unless specifically indicated above     Objective:    BP (!) 144/84 (BP Location: Left Arm, Patient Position: Sitting) Comment: took Sudafed  Pulse 98   Temp 98 F (36.7 C) (Oral)   Wt 233 lb 12.8 oz (106.1 kg)   SpO2 97%   BMI 38.91 kg/m   Wt Readings from Last 3 Encounters:  09/01/23 233 lb 12.8 oz (106.1 kg)  08/10/23 230 lb 3.2 oz (104.4 kg)  05/22/23 230 lb (104.3 kg)    Physical Exam Vitals and nursing note reviewed.  Constitutional:      General: She is awake. She is not in acute distress.    Appearance: She is well-developed and well-groomed. She is obese. She is ill-appearing. She is not toxic-appearing.  HENT:     Head: Normocephalic.     Right Ear: Hearing, ear canal and external ear normal. A middle ear effusion is present. Tympanic membrane is not injected or perforated.     Left Ear: Hearing, ear canal and external ear normal. A middle ear effusion is present. Tympanic membrane is not injected or perforated.     Nose: Rhinorrhea present. Rhinorrhea is clear.     Right Sinus: Frontal sinus tenderness present. No maxillary sinus tenderness.     Left Sinus: Frontal sinus tenderness present. No maxillary sinus tenderness.  Mouth/Throat:     Mouth: Mucous membranes are moist.     Pharynx: Posterior oropharyngeal erythema (mild) present. No pharyngeal swelling or oropharyngeal exudate.  Eyes:     General: Lids are normal.        Right eye: No discharge.        Left eye: No discharge.     Conjunctiva/sclera: Conjunctivae normal.     Pupils: Pupils are equal, round, and reactive to light.  Neck:     Thyroid: No thyromegaly.     Vascular: No carotid bruit.  Cardiovascular:     Rate and Rhythm: Normal rate and regular rhythm.      Heart sounds: Normal heart sounds. No murmur heard.    No gallop.  Pulmonary:     Effort: Pulmonary effort is normal. No accessory muscle usage or respiratory distress.     Breath sounds: Normal breath sounds. No decreased breath sounds, wheezing or rhonchi.  Abdominal:     General: Bowel sounds are normal.     Palpations: Abdomen is soft. There is no hepatomegaly or splenomegaly.  Musculoskeletal:     Cervical back: Normal range of motion and neck supple.     Right lower leg: No edema.     Left lower leg: No edema.  Lymphadenopathy:     Head:     Right side of head: Submandibular and tonsillar adenopathy present. No submental, preauricular or posterior auricular adenopathy.     Left side of head: Submandibular and tonsillar adenopathy present. No submental, preauricular or posterior auricular adenopathy.     Cervical: No cervical adenopathy.  Skin:    General: Skin is warm and dry.  Neurological:     Mental Status: She is alert and oriented to person, place, and time.  Psychiatric:        Attention and Perception: Attention normal.        Mood and Affect: Mood normal.        Speech: Speech normal.        Behavior: Behavior normal. Behavior is cooperative.        Thought Content: Thought content normal.    Results for orders placed or performed in visit on 08/10/23  Urinalysis, Routine w reflex microscopic  Result Value Ref Range   Specific Gravity, UA 1.015 1.005 - 1.030   pH, UA 7.0 5.0 - 7.5   Color, UA Yellow Yellow   Appearance Ur Clear Clear   Leukocytes,UA Negative Negative   Protein,UA Negative Negative/Trace   Glucose, UA Negative Negative   Ketones, UA Negative Negative   RBC, UA Negative Negative   Bilirubin, UA Negative Negative   Urobilinogen, Ur 0.2 0.2 - 1.0 mg/dL   Nitrite, UA Negative Negative   Microscopic Examination Comment       Assessment & Plan:   Problem List Items Addressed This Visit       Respiratory   Frontal sinusitis - Primary     Symptoms present ongoing for one week.  Worsening.  Will not test for Covid or flu as is past treatment periods and has been afebrile.  Start Zpack, no Prednisone as recently had this for back pain.  She reports she will continue Mucinex for symptoms.  Recommend she not take Sudafed, as BP elevation with this.  Recommend: - Increased rest - Increasing Fluids - Acetaminophen as needed for fever/pain.  - Salt water gargling, chloraseptic spray and throat lozenges  - Mucinex.  - Humidifying the air.  Relevant Medications   azithromycin (ZITHROMAX) 250 MG tablet     Follow up plan: Return if symptoms worsen or fail to improve.

## 2023-09-01 NOTE — Assessment & Plan Note (Signed)
Symptoms present ongoing for one week.  Worsening.  Will not test for Covid or flu as is past treatment periods and has been afebrile.  Start Zpack, no Prednisone as recently had this for back pain.  She reports she will continue Mucinex for symptoms.  Recommend she not take Sudafed, as BP elevation with this.  Recommend: - Increased rest - Increasing Fluids - Acetaminophen as needed for fever/pain.  - Salt water gargling, chloraseptic spray and throat lozenges  - Mucinex.  - Humidifying the air.

## 2023-09-01 NOTE — Patient Instructions (Signed)

## 2023-09-27 ENCOUNTER — Other Ambulatory Visit: Payer: Self-pay | Admitting: Nurse Practitioner

## 2023-09-28 NOTE — Telephone Encounter (Signed)
Requested medications are due for refill today.  yes  Requested medications are on the active medications list.  yes  Last refill. 08/13/2022 #90 4 rf  Future visit scheduled.  yes  Notes to clinic.  Labs are expired.    Requested Prescriptions  Pending Prescriptions Disp Refills   levothyroxine (SYNTHROID) 75 MCG tablet [Pharmacy Med Name: LEVOTHYROXINE 75 MCG TABLET] 90 tablet 4    Sig: TAKE 1 TABLET BY MOUTH EVERY DAY     Endocrinology:  Hypothyroid Agents Failed - 09/27/2023  9:12 AM      Failed - TSH in normal range and within 360 days    TSH  Date Value Ref Range Status  03/23/2020 2.230 0.450 - 4.500 uIU/mL Final         Passed - Valid encounter within last 12 months    Recent Outpatient Visits           3 weeks ago Acute non-recurrent frontal sinusitis   [facility] [place], [place] T, NP   1 month ago Acute right-sided low back pain without sciatica   [facility] [facility] [place], [place] T, NP   1 month ago Acute right-sided low back pain without sciatica   [facility] [place], Corrie Dandy T, NP   4 months ago Severe depression (HCC)   [facility] [facility] [place], Corrie Dandy T, NP   1 year ago Severe depression (HCC)   [facility] [facility] [place], Dorie Rank, NP       Future Appointments             In 8 months [place], Dorie Rank, NP  [facility]

## 2023-10-03 ENCOUNTER — Encounter: Payer: Self-pay | Admitting: Nurse Practitioner

## 2023-10-07 MED ORDER — ROSUVASTATIN CALCIUM 10 MG PO TABS: 10.0000 mg | ORAL_TABLET | Freq: Every day | ORAL | 3 refills | Status: DC

## 2023-10-15 ENCOUNTER — Telehealth: Payer: Self-pay | Admitting: Nurse Practitioner

## 2023-10-15 DIAGNOSIS — E7800 Pure hypercholesterolemia, unspecified: Secondary | ICD-10-CM

## 2023-10-15 DIAGNOSIS — R7303 Prediabetes: Secondary | ICD-10-CM

## 2023-10-15 NOTE — Telephone Encounter (Signed)
Pt sent a request for an appointment through MyChart and in that she had a message to the provider which was,"I would like to get a Cardiac CT scan.  I have had several of my coworkers and friends get one.  I know it's not covered by insurance.  Please help me to get this scheduled."  I called patient to get her scheduled for an appointment and she stated that she just wanted to see if the provider would put the order in to receive this as it is not covered by insurance and she knows she will have to pay out of pocket.  I informed her that she may need an appointment.  Please advise.

## 2023-10-23 ENCOUNTER — Ambulatory Visit
Admission: RE | Admit: 2023-10-23 | Discharge: 2023-10-23 | Disposition: A | Discharge status: Home / Self Care | Payer: 59 | Source: Ambulatory Visit | Attending: Nurse Practitioner | Admitting: Nurse Practitioner

## 2023-10-23 DIAGNOSIS — E7800 Pure hypercholesterolemia, unspecified: Secondary | ICD-10-CM | POA: Insufficient documentation

## 2023-10-23 NOTE — Progress Notes (Signed)
Contacted via MyChart   Your screening returned with score of 0 -- woohoo!!  Continue focus on diet and regulra exercise.  No medication needed.  This means no calcifications were noted.:)

## 2023-10-26 ENCOUNTER — Ambulatory Visit: Payer: 59 | Admitting: Family Medicine

## 2023-10-26 VITALS — BP 137/92 | HR 100 | Temp 97.5°F | Ht 64.96 in | Wt 227.6 lb

## 2023-10-26 DIAGNOSIS — T466X5A Adverse effect of antihyperlipidemic and antiarteriosclerotic drugs, initial encounter: Secondary | ICD-10-CM | POA: Diagnosis not present

## 2023-10-26 DIAGNOSIS — M791 Myalgia, unspecified site: Secondary | ICD-10-CM | POA: Diagnosis not present

## 2023-10-26 NOTE — Progress Notes (Unsigned)
BP (!) 137/92   Pulse 100   Temp (!) 97.5 F (36.4 C) (Oral)   Ht 5' 4.96" (1.65 m)   Wt 227 lb 9.6 oz (103.2 kg)   SpO2 96%   BMI 37.92 kg/m    Subjective:    Patient ID: Rocky Crafts, female    DOB: 08/09/1966, 57 y.o.   MRN: [id number]  HPI: AMAAL DESPRES is a 57 y.o. female  Chief Complaint  Patient presents with   Leg Pain   LEG CRAMPS Patient complains of muscle pain in her bilateral legs after starting crestor for one week. She denies falls or trauma to her legs.  Duration:5 days Pain: yes Severity: moderate  Quality:  sore and tender Location:  bilateral lower legs that radiate to lower thighs Bilateral:  yes Onset: gradual Frequency: intermittent Time of  day:   at random Sudden unintentional leg jerking:   no Paresthesias:   no Decreased sensation:  no Weakness:   no Insomnia:   no Fatigue:   no Alleviating factors: Rest Aggravating factors: Walking Status: stable Treatments attempted: None  Relevant past medical, surgical, family and social history reviewed and updated as indicated. Interim medical history since our last visit reviewed. Allergies and medications reviewed and updated.  Review of Systems  Constitutional:  Negative for fever.  Respiratory: Negative.    Cardiovascular: Negative.   Musculoskeletal:  Positive for arthralgias and myalgias.  Skin:  Negative for rash.   Per HPI unless specifically indicated above     Objective:    BP (!) 137/92   Pulse 100   Temp (!) 97.5 F (36.4 C) (Oral)   Ht 5' 4.96" (1.65 m)   Wt 227 lb 9.6 oz (103.2 kg)   SpO2 96%   BMI 37.92 kg/m   Wt Readings from Last 3 Encounters:  10/26/23 227 lb 9.6 oz (103.2 kg)  09/01/23 233 lb 12.8 oz (106.1 kg)  08/10/23 230 lb 3.2 oz (104.4 kg)    Physical Exam Vitals and nursing note reviewed.  Constitutional:      General: She is awake. She is not in acute distress.    Appearance: Normal appearance. She is well-developed and well-groomed.  She is obese. She is not ill-appearing.  HENT:     Head: Normocephalic and atraumatic.     Right Ear: Hearing and external ear normal. No drainage.     Left Ear: Hearing and external ear normal. No drainage.     Nose: Nose normal.  Eyes:     General: Lids are normal.        Right eye: No discharge.        Left eye: No discharge.     Conjunctiva/sclera: Conjunctivae normal.  Cardiovascular:     Rate and Rhythm: Regular rhythm. Tachycardia present.     Pulses:          Radial pulses are 2+ on the right side and 2+ on the left side.       Posterior tibial pulses are 2+ on the right side and 2+ on the left side.     Heart sounds: Normal heart sounds, S1 normal and S2 normal. No murmur heard.    No gallop.  Pulmonary:     Effort: Pulmonary effort is normal. No accessory muscle usage or respiratory distress.     Breath sounds: Normal breath sounds.  Musculoskeletal:        General: Normal range of motion.     Cervical back: Full  passive range of motion without pain and normal range of motion.     Right upper leg: Tenderness present. No swelling or edema.     Left upper leg: Tenderness present. No swelling or edema.     Right lower leg: No tenderness. No edema.     Left lower leg: No tenderness. No edema.       Legs:  Skin:    General: Skin is warm and dry.     Capillary Refill: Capillary refill takes less than 2 seconds.  Neurological:     Mental Status: She is alert and oriented to person, place, and time.  Psychiatric:        Attention and Perception: Attention normal.        Mood and Affect: Mood normal.        Speech: Speech normal.        Behavior: Behavior normal. Behavior is cooperative.        Thought Content: Thought content normal.     Results for orders placed or performed in visit on 08/10/23  Urinalysis, Routine w reflex microscopic  Result Value Ref Range   Specific Gravity, UA 1.015 1.005 - 1.030   pH, UA 7.0 5.0 - 7.5   Color, UA Yellow Yellow   Appearance  Ur Clear Clear   Leukocytes,UA Negative Negative   Protein,UA Negative Negative/Trace   Glucose, UA Negative Negative   Ketones, UA Negative Negative   RBC, UA Negative Negative   Bilirubin, UA Negative Negative   Urobilinogen, Ur 0.2 0.2 - 1.0 mg/dL   Nitrite, UA Negative Negative   Microscopic Examination Comment       Assessment & Plan:   Problem List Items Addressed This Visit     Myalgia due to statin - Primary    Acute, stable. Instructed to discontinue statin, will explore other options at follow up appointment. Recommend otc ibuprofen for leg pain, and flexeril at bedtime as needed to help with relief. Return in 1 month, call sooner if concerns arise.          Follow up plan: Return if symptoms worsen or fail to improve, for Follow up.

## 2023-10-27 DIAGNOSIS — T466X5A Adverse effect of antihyperlipidemic and antiarteriosclerotic drugs, initial encounter: Secondary | ICD-10-CM | POA: Insufficient documentation

## 2023-10-27 NOTE — Assessment & Plan Note (Addendum)
Acute, stable. Instructed to discontinue statin, will explore other options at follow up appointment. Recommend otc ibuprofen for leg pain, and flexeril at bedtime as needed to help with relief. Return in 1 month, call sooner if concerns arise.

## 2023-11-28 DIAGNOSIS — R7309 Other abnormal glucose: Secondary | ICD-10-CM | POA: Insufficient documentation

## 2023-12-04 ENCOUNTER — Ambulatory Visit: Payer: 59 | Admitting: Nurse Practitioner

## 2023-12-04 DIAGNOSIS — E7800 Pure hypercholesterolemia, unspecified: Secondary | ICD-10-CM

## 2023-12-04 DIAGNOSIS — R7309 Other abnormal glucose: Secondary | ICD-10-CM

## 2023-12-20 ENCOUNTER — Other Ambulatory Visit: Payer: Self-pay | Admitting: Nurse Practitioner

## 2023-12-21 ENCOUNTER — Ambulatory Visit: Payer: 59 | Admitting: Family Medicine

## 2023-12-21 ENCOUNTER — Encounter: Payer: Self-pay | Admitting: Family Medicine

## 2023-12-21 ENCOUNTER — Ambulatory Visit: Payer: Self-pay | Admitting: *Deleted

## 2023-12-21 VITALS — BP 127/83 | HR 89 | Temp 97.5°F | Wt 215.6 lb

## 2023-12-21 DIAGNOSIS — M545 Low back pain, unspecified: Secondary | ICD-10-CM | POA: Diagnosis not present

## 2023-12-21 MED ORDER — TRIAMCINOLONE ACETONIDE 40 MG/ML IJ SUSP
40.0000 mg | Freq: Once | INTRAMUSCULAR | Status: AC
  Administered 2023-12-21: 40 mg via INTRAMUSCULAR

## 2023-12-21 MED ORDER — PREDNISONE 10 MG PO TABS: ORAL_TABLET | ORAL | 0 refills | Status: DC

## 2023-12-21 MED ORDER — CYCLOBENZAPRINE HCL 10 MG PO TABS: 10.0000 mg | ORAL_TABLET | Freq: Three times a day (TID) | ORAL | 0 refills | Status: AC | PRN

## 2023-12-21 NOTE — Telephone Encounter (Signed)
Called and LVM notifying patient of Jolene's response. Asked for patient to please call back if she would like to be seen.

## 2023-12-21 NOTE — Telephone Encounter (Signed)
  Chief Complaint: Muscle spasms in lower back.   Requesting refill of prednisone.   Seen by Aura Dials, NP for this on 08/26/2023.   Flexeril refill request already addressed via MyChart request.   "I just need the prednisone".   "Jolene already approved the Flexeril"  Symptoms: Muscle spasms in lower back without radiation down either leg.   Woke up with lower back spasms yesterday morning and again this morning.   Hard to stand up straight. Frequency: Woke up Sunday morning like this, spasms in my lower back making it hard to stand up straight.   Jolene prescribed Flexeril and prednisone for me last time in Sept. And it really helped.   Would she prescribe me this again? Pertinent Negatives: Patient denies radiating down either leg or injury or accidents involving her back.   "I woke up like this Sunday morning". Disposition: [] ED /[] Urgent Care (no appt availability in office) / [] Appointment(In office/virtual)/ []  [facility]/ [] Home Care/ [] Refused Recommended Disposition /[] [facility]/ [x]  Follow-up with PCP Additional Notes: Message sent to Aura Dials, NP with request for prednisone.   Per pt Jolene has already approved the Flexeril via her MyChart request.   Just needs the prednisone.   [facility] to [id number] [address] In [place].       She is requesting an as needed refill on this so she doesn't have to call in every time when her back acts up for the Flexeril and prednisone.

## 2023-12-21 NOTE — Progress Notes (Signed)
BP 127/83   Pulse 89   Temp (!) 97.5 F (36.4 C) (Oral)   Wt 215 lb 9.6 oz (97.8 kg)   SpO2 95%   BMI 35.92 kg/m    Subjective:    Patient ID: Amy Gilbert, female    DOB: 06-26-1966, 58 y.o.   MRN: [id number]  HPI: Amy Gilbert is a 57 y.o. female  Chief Complaint  Patient presents with   Back Pain    Patient says she has been having "back spasms" since yesterday. Patient says she has had this issues previously. Patient says she was requesting a refill of her Prednisone to take along with the Flexeril. Patient says she has imaging back in September when she first noticed the issue. Patient says she just woke up this way. Patient says the pain gets worse when she moves around or walks.    BACK PAIN Duration: 1 day Mechanism of injury: no trauma Location: bilateral and low back Onset: sudden Severity: severe Quality: sharp and aching Frequency: intermittent Radiation: none Aggravating factors: moving Alleviating factors: sitting, heating pad, flexeril Status: stable Treatments attempted:  flexeril, rest, heat, APAP, and ibuprofen  Relief with NSAIDs?: mild Nighttime pain:  yes Paresthesias / decreased sensation:  no Bowel / bladder incontinence:  no Fevers:  no Dysuria / urinary frequency:  no   Relevant past medical, surgical, family and social history reviewed and updated as indicated. Interim medical history since our last visit reviewed. Allergies and medications reviewed and updated.  Review of Systems  Constitutional: Negative.   Respiratory: Negative.    Cardiovascular: Negative.   Musculoskeletal:  Positive for back pain and myalgias. Negative for arthralgias, gait problem, joint swelling, neck pain and neck stiffness.  Skin: Negative.   Neurological: Negative.   Psychiatric/Behavioral: Negative.      Per HPI unless specifically indicated above     Objective:    BP 127/83   Pulse 89   Temp (!) 97.5 F (36.4 C) (Oral)   Wt 215 lb  9.6 oz (97.8 kg)   SpO2 95%   BMI 35.92 kg/m   Wt Readings from Last 3 Encounters:  12/21/23 215 lb 9.6 oz (97.8 kg)  10/26/23 227 lb 9.6 oz (103.2 kg)  09/01/23 233 lb 12.8 oz (106.1 kg)    Physical Exam Vitals and nursing note reviewed.  Constitutional:      General: She is not in acute distress.    Appearance: Normal appearance. She is not ill-appearing, toxic-appearing or diaphoretic.  HENT:     Head: Normocephalic and atraumatic.     Right Ear: External ear normal.     Left Ear: External ear normal.     Nose: Nose normal.     Mouth/Throat:     Mouth: Mucous membranes are moist.     Pharynx: Oropharynx is clear.  Eyes:     General: No scleral icterus.       Right eye: No discharge.        Left eye: No discharge.     Extraocular Movements: Extraocular movements intact.     Conjunctiva/sclera: Conjunctivae normal.     Pupils: Pupils are equal, round, and reactive to light.  Cardiovascular:     Rate and Rhythm: Normal rate and regular rhythm.     Pulses: Normal pulses.     Heart sounds: Normal heart sounds. No murmur heard.    No friction rub. No gallop.  Pulmonary:     Effort: Pulmonary effort is normal. No  respiratory distress.     Breath sounds: Normal breath sounds. No stridor. No wheezing, rhonchi or rales.  Chest:     Chest wall: No tenderness.  Musculoskeletal:        General: Tenderness present.     Cervical back: Normal range of motion and neck supple.     Comments: Lower lumbar paraspinal spasm bilaterally  Skin:    General: Skin is warm and dry.     Capillary Refill: Capillary refill takes less than 2 seconds.     Coloration: Skin is not jaundiced or pale.     Findings: No bruising, erythema, lesion or rash.  Neurological:     General: No focal deficit present.     Mental Status: She is alert and oriented to person, place, and time. Mental status is at baseline.  Psychiatric:        Mood and Affect: Mood normal.        Behavior: Behavior normal.         Thought Content: Thought content normal.        Judgment: Judgment normal.     Results for orders placed or performed in visit on 08/10/23  Urinalysis, Routine w reflex microscopic   Collection Time: 08/10/23  9:44 AM  Result Value Ref Range   Specific Gravity, UA 1.015 1.005 - 1.030   pH, UA 7.0 5.0 - 7.5   Color, UA Yellow Yellow   Appearance Ur Clear Clear   Leukocytes,UA Negative Negative   Protein,UA Negative Negative/Trace   Glucose, UA Negative Negative   Ketones, UA Negative Negative   RBC, UA Negative Negative   Bilirubin, UA Negative Negative   Urobilinogen, Ur 0.2 0.2 - 1.0 mg/dL   Nitrite, UA Negative Negative   Microscopic Examination Comment       Assessment & Plan:   Problem List Items Addressed This Visit   None Visit Diagnoses       Acute bilateral low back pain without sciatica    -  Primary   Will continue her flexeril and treat with steroid taper. Discussed PT- would like to hold until she sees Jolene in follow up. Exercises given. Call w concerns.   Relevant Medications   predniSONE (DELTASONE) 10 MG tablet   triamcinolone acetonide (KENALOG-40) injection 40 mg (Start on 12/21/2023  4:00 PM)        Follow up plan: Return for As scheduled.

## 2023-12-21 NOTE — Telephone Encounter (Signed)
Message from [place] E sent at 12/21/2023  8:05 AM EST  Summary: Muscle spasms, Rx request   Pt called reporting that she is experiencing muscle spasms just like she did last September. Cannot stand up straight, is requesting a refill of Flexeril and a steroid like last time because that worked.  [id number] [place], [place] - [address] [place] [address] Phone: [phone] Fax: [phone]          Call History  Contact Date/Time Type Contact Phone/Fax By  12/21/2023 08:04 AM EST Phone (Incoming) Amy Gilbert, Amy Gilbert (Self) [phone] Judie Petit) Randol Kern   Reason for Disposition  Back pain    Having lower back muscle spasms.   Seen for this in Sept. 2024.   Requesting refill of the prednisone.   Flexeril already been addressed by Jolene from MyChart request.  Answer Assessment - Initial Assessment Questions 1. ONSET: "When did the pain begin?"      I woke up like this Sunday morning.   This morning I can't stand up straight due to the muscle spasms in my lower back.    I saw Jolene for this back in Sept and she prescribed me Flexeril and Prednisone which really helped.   (Seen 08/26/2023).   I put in a refill for the Flexeril on MyChart and she has already given me a refill for that.   I just need the prednisone.  2. LOCATION: "Where does it hurt?" (upper, mid or lower back)     Just my lower back like back in Sept. 2024.  Denies radiating down either leg.   "I didn't do anything to my back, I just woke up like this".    Jolene told me this would probably happen again.   Well, now it's happening. 3. SEVERITY: "How bad is the pain?"  (e.g., Scale 1-10; mild, moderate, or severe)   - MILD (1-3): Doesn't interfere with normal activities.    - MODERATE (4-7): Interferes with normal activities or awakens from sleep.    - SEVERE (8-10): Excruciating pain, unable to do any normal activities.      Moderate 4. PATTERN: "Is the pain constant?" (e.g., yes, no;  constant, intermittent)      Yes it's the muscle spasms like I had before. 5. RADIATION: "Does the pain shoot into your legs or somewhere else?"     No 6. CAUSE:  "What do you think is causing the back pain?"      Muscle spasms like I had in Sept 2024. 7. BACK OVERUSE:  "Any recent lifting of heavy objects, strenuous work or exercise?"     No    "I've not done anything to my back".    "I just woke up like this yesterday morning". 8. MEDICINES: "What have you taken so far for the pain?" (e.g., nothing, acetaminophen, NSAIDS)     Not asked.    "The Flexeril and prednisone combination worked great last time.".   9. NEUROLOGIC SYMPTOMS: "Do you have any weakness, numbness, or problems with bowel/bladder control?"     No 10. OTHER SYMPTOMS: "Do you have any other symptoms?" (e.g., fever, abdomen pain, burning with urination, blood in urine)       Not asked since same muscle spasms she experienced in Sept. 11. PREGNANCY: "Is there any chance you are pregnant?" "When was your last menstrual period?"       N/A due to age  Protocols used: Back Pain-A-AH

## 2023-12-27 NOTE — Patient Instructions (Incomplete)
Chronic Back Pain Chronic back pain is back pain that lasts longer than 3 months. The pain may get worse at certain times (flare-ups). There are things you can do at home to manage your pain. Follow these instructions at home: Watch for any changes in your symptoms. Take these actions to help with your pain: Managing pain and stiffness     If told, put ice on the painful area. You may be told to use ice for 24-48 hours after a flare-up starts. Put ice in a plastic bag. Place a towel between your skin and the bag. Leave the ice on for 20 minutes, 2-3 times a day. If told, put heat on the painful area. Do this as often as told by your doctor. Use the heat source that your doctor recommends, such as a moist heat pack or a heating pad. Place a towel between your skin and the heat source. Leave the heat on for 20-30 minutes. If your skin turns bright red, take off the ice or heat right away to prevent skin damage. The risk of damage is higher if you cannot feel pain, heat, or cold. Soak in a warm bath. This can help with pain. Activity        Avoid bending and other activities that make the pain worse. When you stand: Keep your upper back and neck straight. Keep your shoulders pulled back. Avoid slouching. When you sit: Keep your back straight. Relax your shoulders. Do not round your shoulders or pull them backward. Do not sit or stand in one place for too long. Take short rest breaks during the day. Lying down or standing is often better than sitting. Resting can help relieve pain. When sitting or lying down for a long time, do some mild activity or stretching. This will help to prevent stiffness and pain. Get regular exercise. Ask your doctor what activities are safe for you. You may have to avoid lifting. Ask your provider how much you can safely lift. If you lift things: Bend your knees. Keep the weight close to your body. Avoid twisting. Medicines Take over-the-counter and  prescription medicines only as told by your doctor. You may need to take medicines for pain and swelling. These may be taken by mouth or put on the skin. You may also be given muscle relaxants. Ask your doctor if the medicine prescribed to you: Requires you to avoid driving or using machinery. Can cause trouble pooping (constipation). You may need to take these actions to prevent or treat trouble pooping: Drink enough fluid to keep your pee (urine) pale yellow. Take over-the-counter or prescription medicines. Eat foods that are high in fiber. These include beans, whole grains, and fresh fruits and vegetables. Limit foods that are high in fat and sugars. These include fried or sweet foods. General instructions  Sleep on a firm mattress. Try lying on your side with your knees slightly bent. If you lie on your back, put a pillow under your knees. Do not smoke or use any products that contain nicotine or tobacco. If you need help quitting, ask your doctor. Contact a doctor if: Your pain does not get better with rest or medicine. You have new pain. You have a fever. You lose weight quickly. You have trouble doing your normal activities. One or both of your legs or feet feel weak. One or both of your legs or feet lose feeling (have numbness). Get help right away if: You are not able to control when you pee  or poop. You have bad back pain and: You feel like you may vomit (nauseous). You vomit. You have pain in your chest or your belly (abdomen). You have shortness of breath. You faint. These symptoms may be an emergency. Get help right away. Call 911. Do not wait to see if the symptoms will go away. Do not drive yourself to the hospital. This information is not intended to replace advice given to you by your health care provider. Make sure you discuss any questions you have with your health care provider. Document Revised: 07/07/2022 Document Reviewed: 07/07/2022 Elsevier Patient Education   2024 [employer].

## 2023-12-30 ENCOUNTER — Ambulatory Visit: Payer: 59 | Admitting: Nurse Practitioner

## 2023-12-30 ENCOUNTER — Encounter: Payer: Self-pay | Admitting: Nurse Practitioner

## 2023-12-30 VITALS — BP 109/74 | HR 87 | Temp 98.5°F | Ht 65.0 in | Wt 210.4 lb

## 2023-12-30 DIAGNOSIS — Z6835 Body mass index (BMI) 35.0-35.9, adult: Secondary | ICD-10-CM

## 2023-12-30 DIAGNOSIS — E66812 Obesity, class 2: Secondary | ICD-10-CM

## 2023-12-30 DIAGNOSIS — E6609 Other obesity due to excess calories: Secondary | ICD-10-CM

## 2023-12-30 DIAGNOSIS — E7800 Pure hypercholesterolemia, unspecified: Secondary | ICD-10-CM

## 2023-12-30 DIAGNOSIS — E030 Congenital hypothyroidism with diffuse goiter: Secondary | ICD-10-CM

## 2023-12-30 DIAGNOSIS — F322 Major depressive disorder, single episode, severe without psychotic features: Secondary | ICD-10-CM

## 2023-12-30 NOTE — Progress Notes (Signed)
BP 109/74   Pulse 87   Temp 98.5 F (36.9 C) (Oral)   Ht 5\' 5"  (1.651 m)   Wt 210 lb 6.4 oz (95.4 kg)   SpO2 95%   BMI 35.01 kg/m    Subjective:    Patient ID: Amy Gilbert, female    DOB: [birth year], 58 y.o.   MRN: [id number]  HPI: Amy Gilbert is a 58 y.o. female  Chief Complaint  Patient presents with   Depression   Hyperlipidemia   Hypothyroidism   HYPOTHYROIDISM Taking Levothyroxine 75 MCG. Thyroid control status:stable Satisfied with current treatment? yes Medication side effects: no Medication compliance: good compliance Etiology of hypothyroidism: unknown Recent dose adjustment:no Fatigue: no Cold intolerance: no Heat intolerance: no Weight gain: no Weight loss: no Constipation: no Diarrhea/loose stools: no Palpitations: no Lower extremity edema: no Anxiety/depressed mood: no   HYPERLIPIDEMIA No current medication, tried statin with discomfort.  Calcium score on imaging was 0. Supplements: none Aspirin:  no The ASCVD Risk score (Arnett DK, et al., 2019) failed to calculate for the following reasons:   Cannot find a previous HDL lab   Cannot find a previous total cholesterol lab Chest pain:  no Coronary artery disease:  no Family history CAD:  no Family history early CAD:  no   DEPRESSION Weaned self off Celexa, has been off of this for about 6 weeks and is doing well per patient.  Mood status: stable Satisfied with current treatment?: yes Symptom severity: mild  Depressed mood: no Anxious mood: no Anhedonia: no Significant weight loss or gain: no Insomnia: none Fatigue: no Feelings of worthlessness or guilt: no Impaired concentration/indecisiveness: no Suicidal ideations: no Hopelessness: no Crying spells: no    12/30/2023    3:57 PM 12/21/2023    3:25 PM 10/26/2023    4:16 PM 09/01/2023    4:26 PM 08/26/2023    3:17 PM  Depression screen PHQ 2/9  Decreased Interest 0 0 0 0 0  Down, Depressed, Hopeless 0 0 0 0 0  PHQ  - 2 Score 0 0 0 0 0  Altered sleeping 0 0 2 0 0  Tired, decreased energy 0 0 3 0 0  Change in appetite 0 0 0 0 0  Feeling bad or failure about yourself  0 0 0 0 0  Trouble concentrating 0 0 0 0 0  Moving slowly or fidgety/restless 0 0 0 0 0  Suicidal thoughts 0 0 0 0 0  PHQ-9 Score 0 0 5 0 0  Difficult doing work/chores Not difficult at all Not difficult at all Not difficult at all Not difficult at all Not difficult at all       12/30/2023    3:58 PM 12/21/2023    3:25 PM 10/26/2023    4:16 PM 09/01/2023    4:26 PM  GAD 7 : Generalized Anxiety Score  Nervous, Anxious, on Edge 0 0 0 0  Control/stop worrying 0 0 0 0  Worry too much - different things 0 0 0 0  Trouble relaxing 0 0 0 0  Restless 0 0 0 0  Easily annoyed or irritable 0 0 0 0  Afraid - awful might happen 0 0 0 0  Total GAD 7 Score 0 0 0 0  Anxiety Difficulty Not difficult at all Not difficult at all Not difficult at all Not difficult at all   Relevant past medical, surgical, family and social history reviewed and updated as indicated. Interim medical history since our  last visit reviewed. Allergies and medications reviewed and updated.  Review of Systems  Constitutional:  Negative for activity change, appetite change, diaphoresis, fatigue and fever.  Respiratory:  Negative for cough, chest tightness and shortness of breath.   Cardiovascular:  Negative for chest pain, palpitations and leg swelling.  Musculoskeletal:  Negative for back pain.  Neurological: Negative.   Psychiatric/Behavioral: Negative.      Per HPI unless specifically indicated above     Objective:    BP 109/74   Pulse 87   Temp 98.5 F (36.9 C) (Oral)   Ht 5\' 5"  (1.651 m)   Wt 210 lb 6.4 oz (95.4 kg)   SpO2 95%   BMI 35.01 kg/m   Wt Readings from Last 3 Encounters:  12/30/23 210 lb 6.4 oz (95.4 kg)  12/21/23 215 lb 9.6 oz (97.8 kg)  10/26/23 227 lb 9.6 oz (103.2 kg)    Physical Exam Vitals and nursing note reviewed.  Constitutional:       General: She is awake. She is not in acute distress.    Appearance: She is well-developed and well-groomed. She is obese. She is not ill-appearing or toxic-appearing.  HENT:     Head: Normocephalic.     Right Ear: Hearing and external ear normal.     Left Ear: Hearing and external ear normal.  Eyes:     General: Lids are normal.        Right eye: No discharge.        Left eye: No discharge.     Conjunctiva/sclera: Conjunctivae normal.     Pupils: Pupils are equal, round, and reactive to light.  Neck:     Thyroid: No thyromegaly.     Vascular: No carotid bruit.  Cardiovascular:     Rate and Rhythm: Normal rate and regular rhythm.     Heart sounds: Normal heart sounds. No murmur heard.    No gallop.  Pulmonary:     Effort: Pulmonary effort is normal. No accessory muscle usage or respiratory distress.     Breath sounds: Normal breath sounds.  Abdominal:     General: Bowel sounds are normal. There is no distension.     Palpations: Abdomen is soft.     Tenderness: There is no abdominal tenderness.  Musculoskeletal:     Cervical back: Normal range of motion and neck supple.     Right lower leg: No edema.     Left lower leg: No edema.  Lymphadenopathy:     Cervical: No cervical adenopathy.  Skin:    General: Skin is warm and dry.  Neurological:     Mental Status: She is alert and oriented to person, place, and time.     Deep Tendon Reflexes: Reflexes are normal and symmetric.     Reflex Scores:      Brachioradialis reflexes are 2+ on the right side and 2+ on the left side.      Patellar reflexes are 2+ on the right side and 2+ on the left side. Psychiatric:        Attention and Perception: Attention normal.        Mood and Affect: Mood normal.        Speech: Speech normal.        Behavior: Behavior normal. Behavior is cooperative.        Thought Content: Thought content normal.    Results for orders placed or performed in visit on 08/10/23  Urinalysis, Routine w reflex  microscopic  Collection Time: 08/10/23  9:44 AM  Result Value Ref Range   Specific Gravity, UA 1.015 1.005 - 1.030   pH, UA 7.0 5.0 - 7.5   Color, UA Yellow Yellow   Appearance Ur Clear Clear   Leukocytes,UA Negative Negative   Protein,UA Negative Negative/Trace   Glucose, UA Negative Negative   Ketones, UA Negative Negative   RBC, UA Negative Negative   Bilirubin, UA Negative Negative   Urobilinogen, Ur 0.2 0.2 - 1.0 mg/dL   Nitrite, UA Negative Negative   Microscopic Examination Comment       Assessment & Plan:   Problem List Items Addressed This Visit       Endocrine   Hypothyroidism   Chronic, stable with current Levothyroxine dosing. She works for [employer] and printed labs to be obtained there for thyroid check -- she will send these to PCP for review at physical and we will adjust medication as needed.  Refills up to date.        Other   Hyperlipidemia   Ongoing with calcium scoring 0, educated patient on diet changes and fish oil supplement.  She is aware of reasons for statin initiation.  She will send [facility] labs to provider for review at physical.      Obesity   BMI 35.01 with 17 pounds lost with diet focus since November.  Recommended eating smaller high protein, low fat meals more frequently and exercising 30 mins a day 5 times a week with a goal of 10-15lb weight loss in the next 3 months. Patient voiced their understanding and motivation to adhere to these recommendations.  Discussed Wegovy and Zepbound, may consider adding this and look into coverage for it.       Severe depression (HCC) - Primary   Chronic, stable.  Denies SI/HI.  No current medications, she weaned self off Celexa and is doing well without it.        Follow up plan: Return in about 5 months (around 05/23/2024) for Annual Physical -- after 05/21/24.

## 2023-12-30 NOTE — Assessment & Plan Note (Signed)
BMI 35.01 with 17 pounds lost with diet focus since November.  Recommended eating smaller high protein, low fat meals more frequently and exercising 30 mins a day 5 times a week with a goal of 10-15lb weight loss in the next 3 months. Patient voiced their understanding and motivation to adhere to these recommendations.  Discussed Wegovy and Zepbound, may consider adding this and look into coverage for it.

## 2023-12-30 NOTE — Assessment & Plan Note (Signed)
Ongoing with calcium scoring 0, educated patient on diet changes and fish oil supplement.  She is aware of reasons for statin initiation.  She will send [facility] labs to provider for review at physical.

## 2023-12-30 NOTE — Assessment & Plan Note (Signed)
Chronic, stable with current Levothyroxine dosing. She works for [employer] and printed labs to be obtained there for thyroid check -- she will send these to PCP for review at physical and we will adjust medication as needed.  Refills up to date.

## 2023-12-30 NOTE — Assessment & Plan Note (Signed)
Chronic, stable.  Denies SI/HI.  No current medications, she weaned self off Celexa and is doing well without it.

## 2024-02-13 ENCOUNTER — Encounter: Payer: Self-pay | Admitting: Nurse Practitioner

## 2024-02-13 DIAGNOSIS — Z789 Other specified health status: Secondary | ICD-10-CM

## 2024-05-17 ENCOUNTER — Other Ambulatory Visit: Payer: Self-pay

## 2024-05-17 ENCOUNTER — Telehealth: Payer: Self-pay

## 2024-05-17 ENCOUNTER — Other Ambulatory Visit: Payer: Self-pay | Admitting: Nurse Practitioner

## 2024-05-17 DIAGNOSIS — Z860100 Personal history of colon polyps, unspecified: Secondary | ICD-10-CM

## 2024-05-17 DIAGNOSIS — Z1231 Encounter for screening mammogram for malignant neoplasm of breast: Secondary | ICD-10-CM

## 2024-05-17 MED ORDER — NA SULFATE-K SULFATE-MG SULF 17.5-3.13-1.6 GM/177ML PO SOLN: 1.0000 | Freq: Once | ORAL | 0 refills | Status: AC

## 2024-05-17 NOTE — Telephone Encounter (Signed)
 Gastroenterology Pre-Procedure Review  Request Date: 06/21/24 Requesting Physician: Dr. Ole Berkeley  PATIENT REVIEW QUESTIONS: The patient responded to the following health history questions as indicated:    1. Are you having any GI issues? no 2. Do you have a personal history of Polyps? yes (last colonoscopy performed by Dr. Tully Gainer on 08/04/2018 recommended repeat in 5 years) 3. Do you have a family history of Colon Cancer or Polyps? no 4. Diabetes Mellitus? no 5. Joint replacements in the past 12 months?no 6. Major health problems in the past 3 months?no 7. Any artificial heart valves, MVP, or defibrillator?no    MEDICATIONS & ALLERGIES:    Patient reports the following regarding taking any anticoagulation/antiplatelet therapy:   Plavix, Coumadin, Eliquis, Xarelto, Lovenox, Pradaxa, Brilinta, or Effient? no Aspirin? no  Patient confirms/reports the following medications:  Current Outpatient Medications  Medication Sig Dispense Refill   Na Sulfate-K Sulfate-Mg Sulfate concentrate (SUPREP) 17.5-3.13-1.6 GM/177ML SOLN Take 1 kit (354 mLs total) by mouth once for 1 dose. 354 mL 0   cyclobenzaprine  (FLEXERIL ) 10 MG tablet Take 1 tablet (10 mg total) by mouth 3 (three) times daily as needed for muscle spasms. 45 tablet 0   levothyroxine  (SYNTHROID ) 75 MCG tablet TAKE 1 TABLET BY MOUTH EVERY DAY 90 tablet 4   No current facility-administered medications for this visit.    Patient confirms/reports the following allergies:  Allergies  Allergen Reactions   Rosuvastatin  Other (See Comments)    Myalgia of bilateral legs    No orders of the defined types were placed in this encounter.   AUTHORIZATION INFORMATION Primary Insurance: 1D#: Group #:  Secondary Insurance: 1D#: Group #:  SCHEDULE INFORMATION: Date: 06/21/24 Time: Location: [facility]

## 2024-05-17 NOTE — Telephone Encounter (Signed)
 The patient called in to schedule her colonoscopy.

## 2024-05-17 NOTE — Telephone Encounter (Addendum)
 Please add Moira Andrews, because I am not in the office. I am floating. I will help as soon as I can be back at the office.

## 2024-05-21 NOTE — Patient Instructions (Incomplete)
 Managing Depression, Adult Depression is a mental health condition that affects your thoughts, feelings, and actions. Being diagnosed with depression can bring you relief if you did not know why you have felt or behaved a certain way. It could also leave you feeling overwhelmed. Finding ways to manage your symptoms can help you feel more positive about your future. How to manage lifestyle changes Being depressed is difficult. Depression can increase the level of everyday stress. Stress can make depression symptoms worse. You may believe your symptoms cannot be managed or will never improve. However, there are many things you can try to help manage your symptoms. There is hope. Managing stress  Stress is your body's reaction to life changes and events, both good and bad. Stress can add to your feelings of depression. Learning to manage your stress can help lessen your feelings of depression. Try some of the following approaches to reducing your stress (stress reduction techniques): Listen to music that you enjoy and that inspires you. Try using a meditation app or take a meditation class. Develop a practice that helps you connect with your spiritual self. Walk in nature, pray, or go to a place of worship. Practice deep breathing. To do this, inhale slowly through your nose. Pause at the top of your inhale for a few seconds and then exhale slowly, letting yourself relax. Repeat this three or four times. Practice yoga to help relax and work your muscles. Choose a stress reduction technique that works for you. These techniques take time and practice to develop. Set aside 5-15 minutes a day to do them. Therapists can offer training in these techniques. Do these things to help manage stress: Keep a journal. Know your limits. Set healthy boundaries for yourself and others, such as saying "no" when you think something is too much. Pay attention to how you react to certain situations. You may not be able to  control everything, but you can change your reaction. Add humor to your life by watching funny movies or shows. Make time for activities that you enjoy and that relax you. Spend less time using electronics, especially at night before bed. The light from screens can make your brain think it is time to get up rather than go to bed.  Medicines Medicines, such as antidepressants, are often a part of treatment for depression. Talk with your pharmacist or health care provider about all the medicines, supplements, and herbal products that you take, their possible side effects, and what medicines and other products are safe to take together. Make sure to report any side effects you may have to your health care provider. Relationships Your health care provider may suggest family therapy, couples therapy, or individual therapy as part of your treatment. How to recognize changes Everyone responds differently to treatment for depression. As you recover from depression, you may start to: Have more interest in doing activities. Feel more hopeful. Have more energy. Eat a more regular amount of food. Have better mental focus. It is important to recognize if your depression is not getting better or is getting worse. The symptoms you had in the beginning may return, such as: Feeling tired. Eating too much or too little. Sleeping too much or too little. Feeling restless, agitated, or hopeless. Trouble focusing or making decisions. Having unexplained aches and pains. Feeling irritable, angry, or aggressive. If you or your family members notice these symptoms coming back, let your health care provider know right away. Follow these instructions at home: Activity Try to  get some form of exercise each day, such as walking. Try yoga, mindfulness, or other stress reduction techniques. Participate in group activities if you are able. Lifestyle Get enough sleep. Cut down on or stop using caffeine, tobacco,  alcohol, and any other harmful substances. Eat a healthy diet that includes plenty of vegetables, fruits, whole grains, low-fat dairy products, and lean protein. Limit foods that are high in solid fats, added sugar, or salt (sodium). General instructions Take over-the-counter and prescription medicines only as told by your health care provider. Keep all follow-up visits. It is important for your health care provider to check on your mood, behavior, and medicines. Your health care provider may need to make changes to your treatment. Where to find support Talking to others  Friends and family members can be sources of support and guidance. Talk to trusted friends or family members about your condition. Explain your symptoms and let them know that you are working with a health care provider to treat your depression. Tell friends and family how they can help. Finances Find mental health providers that fit with your financial situation. Talk with your health care provider if you are worried about access to food, housing, or medicine. Call your insurance company to learn about your co-pays and prescription plan. Where to find more information You can find support in your area from: Anxiety and Depression Association of America (ADAA): adaa.org Mental Health [place]: mentalhealthamerica.net [employer] on Mental Illness: nami.org Contact a health care provider if: You stop taking your antidepressant medicines, and you have any of these symptoms: Nausea. Headache. Light-headedness. Chills and body aches. Not being able to sleep (insomnia). You or your friends and family think your depression is getting worse. Get help right away if: You have thoughts of hurting yourself or others. Get help right away if you feel like you may hurt yourself or others, or have thoughts about taking your own life. Go to your nearest emergency room or: Call 911. Call the National Suicide Prevention Lifeline at  360-042-0264 or 988. This is open 24 hours a day. Text the Crisis Text Line at 952-302-5572. This information is not intended to replace advice given to you by your health care provider. Make sure you discuss any questions you have with your health care provider. Document Revised: 03/25/2022 Document Reviewed: 03/25/2022 Elsevier Patient Education  2024 [employer].

## 2024-05-27 ENCOUNTER — Ambulatory Visit (INDEPENDENT_AMBULATORY_CARE_PROVIDER_SITE_OTHER): Payer: 59 | Admitting: Nurse Practitioner

## 2024-05-27 ENCOUNTER — Encounter: Payer: Self-pay | Admitting: Nurse Practitioner

## 2024-05-27 VITALS — BP 118/86 | HR 90 | Temp 97.7°F | Ht 65.0 in | Wt 208.0 lb

## 2024-05-27 DIAGNOSIS — E7800 Pure hypercholesterolemia, unspecified: Secondary | ICD-10-CM

## 2024-05-27 DIAGNOSIS — Z0000 Encounter for general adult medical examination without abnormal findings: Secondary | ICD-10-CM

## 2024-05-27 DIAGNOSIS — F322 Major depressive disorder, single episode, severe without psychotic features: Secondary | ICD-10-CM | POA: Diagnosis not present

## 2024-05-27 DIAGNOSIS — T466X5A Adverse effect of antihyperlipidemic and antiarteriosclerotic drugs, initial encounter: Secondary | ICD-10-CM

## 2024-05-27 DIAGNOSIS — R7309 Other abnormal glucose: Secondary | ICD-10-CM

## 2024-05-27 DIAGNOSIS — E66811 Obesity, class 1: Secondary | ICD-10-CM

## 2024-05-27 DIAGNOSIS — E559 Vitamin D deficiency, unspecified: Secondary | ICD-10-CM | POA: Diagnosis not present

## 2024-05-27 DIAGNOSIS — E030 Congenital hypothyroidism with diffuse goiter: Secondary | ICD-10-CM

## 2024-05-27 DIAGNOSIS — Z6834 Body mass index (BMI) 34.0-34.9, adult: Secondary | ICD-10-CM

## 2024-05-27 DIAGNOSIS — E6609 Other obesity due to excess calories: Secondary | ICD-10-CM

## 2024-05-27 MED ORDER — LEVOTHYROXINE SODIUM 75 MCG PO TABS: 75.0000 ug | ORAL_TABLET | Freq: Every day | ORAL | 4 refills | Status: AC

## 2024-05-27 NOTE — Assessment & Plan Note (Signed)
Ongoing.  Noted on recent [facility] labs -- level 16, have recommended she continue Vitamin D3 2000 units daily and check labs outpatient -- she will send to PCP.

## 2024-05-27 NOTE — Assessment & Plan Note (Signed)
 Chronic, stable.  Denies SI/HI.  No current medications, she weaned self off Celexa and is doing well without it.

## 2024-05-27 NOTE — Assessment & Plan Note (Signed)
 Ongoing with calcium scoring 0, educated patient on diet changes and fish oil supplement.  She is aware of reasons for statin initiation.  She will send [facility] labs to provider for review at physical.

## 2024-05-27 NOTE — Assessment & Plan Note (Addendum)
 BMI 34.61, continues to lose weight.  Recommended eating smaller high protein, low fat meals more frequently and exercising 30 mins a day 5 times a week with a goal of 10-15lb weight loss in the next 3 months. Patient voiced their understanding and motivation to adhere to these recommendations.

## 2024-05-27 NOTE — Assessment & Plan Note (Signed)
 Chronic, stable with current Levothyroxine  dosing. She works for [employer]. She will send labs to PCP for review once available.  Refills up to date.

## 2024-05-27 NOTE — Progress Notes (Signed)
 BP 118/86 (BP Location: Left Arm, Patient Position: Sitting, Cuff Size: Large)   Pulse 90   Temp 97.7 F (36.5 C) (Oral)   Ht 5' 5 (1.651 m)   Wt 208 lb (94.3 kg)   SpO2 95%   BMI 34.61 kg/m    Subjective:    Patient ID: Amy Gilbert, female    DOB: 11-30-1966, 58 y.o.   MRN: [id number]  HPI: Amy Gilbert is a 58 y.o. female presenting on 05/27/2024 for comprehensive medical examination. Current medical complaints include: none  She currently lives with: significant other Menopausal Symptoms: yes -- cycles are sporadic, last February 2025  Vitamin D level low on past labs and taking supplement.  Also taking B12 and multivitamin.  The ASCVD Risk score (Arnett DK, et al., 2019) failed to calculate for the following reasons:   Cannot find a previous HDL lab   Cannot find a previous total cholesterol lab  HYPOTHYROIDISM Takes Levothyroxine  75 MCG (has history of hyperthyroid with treatment which made her more hypo) with labs done at [facility], she will send next ones in September to PCP. Thyroid  control status:controlled Satisfied with current treatment? yes Medication side effects: no Medication compliance: good compliance Etiology of hypothyroidism: none Recent dose adjustment:no Fatigue: no Cold intolerance: no Heat intolerance: yes Weight gain: no Weight loss: no Constipation: no Diarrhea/loose stools: no Palpitations: no Lower extremity edema: no Anxiety/depressed mood: no   DEPRESSION Has been off Celexa  for some time. Mood status: controlled Satisfied with current treatment?: yes Symptom severity: mild  Duration of current treatment : chronic Side effects: no Medication compliance: good compliance Psychotherapy/counseling: none Previous psychiatric medications: Celexa  Depressed mood: no Anxious mood: no Anhedonia: no Significant weight loss or gain: no Insomnia: none Fatigue: no Feelings of worthlessness or guilt: no Impaired  concentration/indecisiveness: no Suicidal ideations: no Hopelessness: no Crying spells: no    05/27/2024    3:39 PM 12/30/2023    3:57 PM 12/21/2023    3:25 PM 10/26/2023    4:16 PM 09/01/2023    4:26 PM  Depression screen PHQ 2/9  Decreased Interest 0 0 0 0 0  Down, Depressed, Hopeless 0 0 0 0 0  PHQ - 2 Score 0 0 0 0 0  Altered sleeping 0 0 0 2 0  Tired, decreased energy 0 0 0 3 0  Change in appetite 0 0 0 0 0  Feeling bad or failure about yourself  0 0 0 0 0  Trouble concentrating 0 0 0 0 0  Moving slowly or fidgety/restless 0 0 0 0 0  Suicidal thoughts 0 0 0 0 0  PHQ-9 Score 0 0 0 5 0  Difficult doing work/chores Not difficult at all Not difficult at all Not difficult at all Not difficult at all Not difficult at all       05/27/2024    3:39 PM 12/30/2023    3:58 PM 12/21/2023    3:25 PM 10/26/2023    4:16 PM  GAD 7 : Generalized Anxiety Score  Nervous, Anxious, on Edge 0 0 0 0  Control/stop worrying 0 0 0 0  Worry too much - different things 0 0 0 0  Trouble relaxing 0 0 0 0  Restless 0 0 0 0  Easily annoyed or irritable 0 0 0 0  Afraid - awful might happen 0 0 0 0  Total GAD 7 Score 0 0 0 0  Anxiety Difficulty Not difficult at all Not difficult at all Not  difficult at all Not difficult at all        08/26/2023    3:16 PM 09/01/2023    4:25 PM 12/21/2023    3:25 PM 12/30/2023    3:57 PM 05/27/2024    3:39 PM  Fall Risk  Falls in the past year? 0 0 0 0 1  Was there an injury with Fall? 0 0 0 0 0  Fall Risk Category Calculator 0 0 0 0 1  Patient at Risk for Falls Due to No Fall Risks No Fall Risks No Fall Risks No Fall Risks History of fall(s)  Fall risk Follow up Falls evaluation completed Falls evaluation completed Falls evaluation completed Falls evaluation completed Falls evaluation completed;Education provided    Functional Status Survey: Is the patient deaf or have difficulty hearing?: No Does the patient have difficulty seeing, even when wearing  glasses/contacts?: No Does the patient have difficulty concentrating, remembering, or making decisions?: No Does the patient have difficulty walking or climbing stairs?: No Does the patient have difficulty dressing or bathing?: No Does the patient have difficulty doing errands alone such as visiting a doctor's office or shopping?: No    Past Medical History:  Past Medical History:  Diagnosis Date   Anxiety    Depression    Lumbago    Malaise and fatigue    Sciatica    Thyroid  disease     Surgical History:  Past Surgical History:  Procedure Laterality Date   COLONOSCOPY WITH PROPOFOL  N/A 08/04/2018   Procedure: COLONOSCOPY WITH PROPOFOL ;  Surgeon: Janalyn Keene NOVAK, MD;  Location: [facility] ENDOSCOPY;  Service: Endoscopy;  Laterality: N/A;   DILATION AND CURETTAGE OF UTERUS     TUBAL LIGATION      Medications:  Current Outpatient Medications on File Prior to Visit  Medication Sig   cyclobenzaprine  (FLEXERIL ) 10 MG tablet Take 1 tablet (10 mg total) by mouth 3 (three) times daily as needed for muscle spasms.   No current facility-administered medications on file prior to visit.    Allergies:  Allergies  Allergen Reactions   Rosuvastatin  Other (See Comments)    Myalgia of bilateral legs    Social History:  Social History   Socioeconomic History   Marital status: Single    Spouse name: Not on file   Number of children: Not on file   Years of education: Not on file   Highest education level: Not on file  Occupational History   Not on file  Tobacco Use   Smoking status: Never   Smokeless tobacco: Never  Vaping Use   Vaping status: Never Used  Substance and Sexual Activity   Alcohol use: Yes    Alcohol/week: 0.0 standard drinks of alcohol    Comment: rarely   Drug use: No   Sexual activity: Yes  Other Topics Concern   Not on file  Social History Narrative   Not on file   Social Drivers of Health   Financial Resource Strain: Low Risk  (05/27/2024)   Overall  Financial Resource Strain (CARDIA)    Difficulty of Paying Living Expenses: Not hard at all  Food Insecurity: Not on file  Transportation Needs: Not on file  Physical Activity: Sufficiently Active (05/27/2024)   Exercise Vital Sign    Days of Exercise per Week: 3 days    Minutes of Exercise per Session: 80 min  Stress: No Stress Concern Present (05/27/2024)   [employer] of Occupational Health - Occupational Stress Questionnaire    Feeling of  Stress: Only a little  Social Connections: Moderately Isolated (05/27/2024)   Social Connection and Isolation Panel    Frequency of Communication with Friends and Family: More than three times a week    Frequency of Social Gatherings with Friends and Family: Once a week    Attends Religious Services: Never    Database administrator or Organizations: No    Attends Banker Meetings: Never    Marital Status: Married  Catering manager Violence: Not At Risk (05/27/2024)   Humiliation, Afraid, Rape, and Kick questionnaire    Fear of Current or Ex-Partner: No    Emotionally Abused: No    Physically Abused: No    Sexually Abused: No   Social History   Tobacco Use  Smoking Status Never  Smokeless Tobacco Never   Social History   Substance and Sexual Activity  Alcohol Use Yes   Alcohol/week: 0.0 standard drinks of alcohol   Comment: rarely    Family History:  Family History  Problem Relation Age of Onset   Cancer Mother        liver age 31's   Lung disease Mother    Cancer Father        non hodgkins lymphoma   Hypertension Father    Diabetes Brother    Heart attack Brother    Diabetes Maternal Grandmother    Cancer Maternal Grandfather        lung   Diabetes Paternal Grandmother     Past medical history, surgical history, medications, allergies, family history and social history reviewed with patient today and changes made to appropriate areas of the chart.   ROS All other ROS negative except what is listed above  and in the HPI.      Objective:    BP 118/86 (BP Location: Left Arm, Patient Position: Sitting, Cuff Size: Large)   Pulse 90   Temp 97.7 F (36.5 C) (Oral)   Ht 5' 5 (1.651 m)   Wt 208 lb (94.3 kg)   SpO2 95%   BMI 34.61 kg/m   Wt Readings from Last 3 Encounters:  05/27/24 208 lb (94.3 kg)  12/30/23 210 lb 6.4 oz (95.4 kg)  12/21/23 215 lb 9.6 oz (97.8 kg)    Physical Exam Vitals and nursing note reviewed. Exam conducted with a chaperone present.  Constitutional:      General: She is awake. She is not in acute distress.    Appearance: She is well-developed and well-groomed. She is not ill-appearing or toxic-appearing.  HENT:     Head: Normocephalic and atraumatic.     Right Ear: Hearing, tympanic membrane, ear canal and external ear normal. No drainage.     Left Ear: Hearing, tympanic membrane, ear canal and external ear normal. No drainage.     Nose: Nose normal.     Right Sinus: No maxillary sinus tenderness or frontal sinus tenderness.     Left Sinus: No maxillary sinus tenderness or frontal sinus tenderness.     Mouth/Throat:     Mouth: Mucous membranes are moist.     Pharynx: Oropharynx is clear. Uvula midline. No pharyngeal swelling, oropharyngeal exudate or posterior oropharyngeal erythema.   Eyes:     General: Lids are normal.        Right eye: No discharge.        Left eye: No discharge.     Extraocular Movements: Extraocular movements intact.     Conjunctiva/sclera: Conjunctivae normal.     Pupils: Pupils are  equal, round, and reactive to light.     Visual Fields: Right eye visual fields normal and left eye visual fields normal.   Neck:     Thyroid : No thyromegaly.     Vascular: No carotid bruit.     Trachea: Trachea normal.   Cardiovascular:     Rate and Rhythm: Normal rate and regular rhythm.     Heart sounds: Normal heart sounds. No murmur heard.    No gallop.  Pulmonary:     Effort: Pulmonary effort is normal. No accessory muscle usage or  respiratory distress.     Breath sounds: Normal breath sounds.  Chest:  Breasts:    Right: Normal.     Left: Normal.  Abdominal:     General: Bowel sounds are normal.     Palpations: Abdomen is soft. There is no hepatomegaly or splenomegaly.     Tenderness: There is no abdominal tenderness.   Musculoskeletal:        General: Normal range of motion.     Cervical back: Normal range of motion and neck supple.     Right lower leg: No edema.     Left lower leg: No edema.  Lymphadenopathy:     Head:     Right side of head: No submental, submandibular, tonsillar, preauricular or posterior auricular adenopathy.     Left side of head: No submental, submandibular, tonsillar, preauricular or posterior auricular adenopathy.     Cervical: No cervical adenopathy.     Upper Body:     Right upper body: No supraclavicular, axillary or pectoral adenopathy.     Left upper body: No supraclavicular, axillary or pectoral adenopathy.   Skin:    General: Skin is warm and dry.     Capillary Refill: Capillary refill takes less than 2 seconds.     Findings: No rash.   Neurological:     Mental Status: She is alert and oriented to person, place, and time.     Gait: Gait is intact.     Deep Tendon Reflexes: Reflexes are normal and symmetric.     Reflex Scores:      Brachioradialis reflexes are 2+ on the right side and 2+ on the left side.      Patellar reflexes are 2+ on the right side and 2+ on the left side.  Psychiatric:        Attention and Perception: Attention normal.        Mood and Affect: Mood normal.        Speech: Speech normal.        Behavior: Behavior normal. Behavior is cooperative.        Thought Content: Thought content normal.        Judgment: Judgment normal.    Results for orders placed or performed in visit on 08/10/23  Urinalysis, Routine w reflex microscopic   Collection Time: 08/10/23  9:44 AM  Result Value Ref Range   Specific Gravity, UA 1.015 1.005 - 1.030   pH, UA  7.0 5.0 - 7.5   Color, UA Yellow Yellow   Appearance Ur Clear Clear   Leukocytes,UA Negative Negative   Protein,UA Negative Negative/Trace   Glucose, UA Negative Negative   Ketones, UA Negative Negative   RBC, UA Negative Negative   Bilirubin, UA Negative Negative   Urobilinogen, Ur 0.2 0.2 - 1.0 mg/dL   Nitrite, UA Negative Negative   Microscopic Examination Comment       Assessment & Plan:   Problem List  Items Addressed This Visit       Endocrine   Hypothyroidism   Chronic, stable with current Levothyroxine  dosing. She works for [employer]. She will send labs to PCP for review once available.  Refills up to date.      Relevant Medications   levothyroxine  (SYNTHROID ) 75 MCG tablet     Other   Vitamin D deficiency   Ongoing.  Noted on recent [facility] labs -- level 16, have recommended she continue Vitamin D3 2000 units daily and check labs outpatient -- she will send to PCP.      Severe depression (HCC) - Primary   Chronic, stable.  Denies SI/HI.  No current medications, she weaned self off Celexa  and is doing well without it.      Obesity   BMI 34.61, continues to lose weight.  Recommended eating smaller high protein, low fat meals more frequently and exercising 30 mins a day 5 times a week with a goal of 10-15lb weight loss in the next 3 months. Patient voiced their understanding and motivation to adhere to these recommendations.         Hyperlipidemia   Ongoing with calcium  scoring 0, educated patient on diet changes and fish oil supplement.  She is aware of reasons for statin initiation.  She will send [facility] labs to provider for review at physical.      Other Visit Diagnoses       Encounter for annual physical exam       Annual physical exam with health maintenance reviewed.         Follow up plan: Return in about 1 year (around 05/27/2025) for Annual Physical.   LABORATORY TESTING:  - Pap smear: Up To Date  IMMUNIZATIONS:   - Tdap: Tetanus vaccination status  reviewed: last tetanus booster within 10 years. - Influenza: Up to date - Pneumovax: Not applicable - Prevnar: Up To Date PCV20 - COVID: Up to date - HPV: Not applicable - Shingrix  vaccine: Up To Date  SCREENING: -Mammogram: Up to date is scheduled next 06/01/24 - Colonoscopy: Has coming up on 06/21/24 - Bone Density: Not applicable  -Hearing Test: Not applicable  -Spirometry: Not applicable   PATIENT COUNSELING:   Advised to take 1 mg of folate supplement per day if capable of pregnancy.   Sexuality: Discussed sexually transmitted diseases, partner selection, use of condoms, avoidance of unintended pregnancy  and contraceptive alternatives.   Advised to avoid cigarette smoking.  I discussed with the patient that most people either abstain from alcohol or drink within safe limits (<=14/week and <=4 drinks/occasion for males, <=7/weeks and <= 3 drinks/occasion for females) and that the risk for alcohol disorders and other health effects rises proportionally with the number of drinks per week and how often a drinker exceeds daily limits.  Discussed cessation/primary prevention of drug use and availability of treatment for abuse.   Diet: Encouraged to adjust caloric intake to maintain  or achieve ideal body weight, to reduce intake of dietary saturated fat and total fat, to limit sodium intake by avoiding high sodium foods and not adding table salt, and to maintain adequate dietary potassium and calcium  preferably from fresh fruits, vegetables, and low-fat dairy products.    Stressed the importance of regular exercise  Injury prevention: Discussed safety belts, safety helmets, smoke detector, smoking near bedding or upholstery.   Dental health: Discussed importance of regular tooth brushing, flossing, and dental visits.    NEXT PREVENTATIVE PHYSICAL DUE IN 1 YEAR. Return in  about 1 year (around 05/27/2025) for Annual Physical.

## 2024-05-27 NOTE — Patient Instructions (Signed)

## 2024-06-01 ENCOUNTER — Ambulatory Visit
Admission: RE | Admit: 2024-06-01 | Discharge: 2024-06-01 | Disposition: A | Discharge status: Home / Self Care | Source: Ambulatory Visit | Attending: Nurse Practitioner | Admitting: Nurse Practitioner

## 2024-06-01 DIAGNOSIS — Z1231 Encounter for screening mammogram for malignant neoplasm of breast: Secondary | ICD-10-CM | POA: Diagnosis present

## 2024-06-07 ENCOUNTER — Ambulatory Visit: Payer: Self-pay | Admitting: Nurse Practitioner

## 2024-06-07 NOTE — Progress Notes (Signed)
 Contacted via MyChart   Normal mammogram, may repeat in one year:)

## 2024-06-20 ENCOUNTER — Encounter: Payer: Self-pay | Admitting: Gastroenterology

## 2024-06-21 ENCOUNTER — Other Ambulatory Visit: Payer: Self-pay

## 2024-06-21 ENCOUNTER — Ambulatory Visit: Admitting: Certified Registered Nurse Anesthetist

## 2024-06-21 ENCOUNTER — Encounter: Payer: Self-pay | Admitting: Gastroenterology

## 2024-06-21 ENCOUNTER — Ambulatory Visit
Admission: RE | Admit: 2024-06-21 | Discharge: 2024-06-21 | Disposition: A | Discharge status: Home / Self Care | Attending: Gastroenterology | Admitting: Gastroenterology

## 2024-06-21 ENCOUNTER — Encounter
Admission: RE | Disposition: A | Discharge status: Home / Self Care | Payer: Self-pay | Source: Home / Self Care | Attending: Gastroenterology

## 2024-06-21 DIAGNOSIS — E039 Hypothyroidism, unspecified: Secondary | ICD-10-CM | POA: Insufficient documentation

## 2024-06-21 DIAGNOSIS — K640 First degree hemorrhoids: Secondary | ICD-10-CM | POA: Insufficient documentation

## 2024-06-21 DIAGNOSIS — K6389 Other specified diseases of intestine: Secondary | ICD-10-CM | POA: Diagnosis not present

## 2024-06-21 DIAGNOSIS — E66813 Obesity, class 3: Secondary | ICD-10-CM | POA: Insufficient documentation

## 2024-06-21 DIAGNOSIS — Z6834 Body mass index (BMI) 34.0-34.9, adult: Secondary | ICD-10-CM | POA: Insufficient documentation

## 2024-06-21 DIAGNOSIS — D123 Benign neoplasm of transverse colon: Secondary | ICD-10-CM | POA: Insufficient documentation

## 2024-06-21 DIAGNOSIS — K635 Polyp of colon: Secondary | ICD-10-CM

## 2024-06-21 DIAGNOSIS — Z1211 Encounter for screening for malignant neoplasm of colon: Secondary | ICD-10-CM | POA: Insufficient documentation

## 2024-06-21 DIAGNOSIS — Z860100 Personal history of colon polyps, unspecified: Secondary | ICD-10-CM

## 2024-06-21 HISTORY — DX: Hypothyroidism, unspecified: E03.9

## 2024-06-21 HISTORY — PX: COLONOSCOPY: SHX5424

## 2024-06-21 HISTORY — PX: POLYPECTOMY: SHX149

## 2024-06-21 SURGERY — COLONOSCOPY
Anesthesia: General

## 2024-06-21 MED ORDER — LIDOCAINE HCL (CARDIAC) PF 100 MG/5ML IV SOSY
PREFILLED_SYRINGE | INTRAVENOUS | Status: DC | PRN
  Administered 2024-06-21: 50 mg via INTRAVENOUS

## 2024-06-21 MED ORDER — PROPOFOL 10 MG/ML IV BOLUS
INTRAVENOUS | Status: DC | PRN
  Administered 2024-06-21: 80 mg via INTRAVENOUS
  Administered 2024-06-21: 150 ug/kg/min via INTRAVENOUS

## 2024-06-21 MED ORDER — SODIUM CHLORIDE 0.9 % IV SOLN: INTRAVENOUS | Status: DC

## 2024-06-21 MED ORDER — DEXMEDETOMIDINE HCL IN NACL 80 MCG/20ML IV SOLN
INTRAVENOUS | Status: DC | PRN
  Administered 2024-06-21: 4 ug via INTRAVENOUS
  Administered 2024-06-21: 8 ug via INTRAVENOUS

## 2024-06-21 NOTE — Anesthesia Postprocedure Evaluation (Signed)
 Anesthesia Post Note  Patient: Amy Gilbert  Procedure(s) Performed: COLONOSCOPY POLYPECTOMY, INTESTINE  Patient location during evaluation: PACU Anesthesia Type: General Level of consciousness: awake Pain management: satisfactory to patient Vital Signs Assessment: post-procedure vital signs reviewed and stable Respiratory status: nonlabored ventilation Cardiovascular status: blood pressure returned to baseline Anesthetic complications: no   No notable events documented.   Last Vitals:  Vitals:   06/21/24 0847 06/21/24 0914  BP: (!) 140/81 107/76  Pulse: (!) 18 88  Resp: 18 20  Temp: (!) 35.9 C (!) 35.9 C  SpO2: 100% 96%    Last Pain:  Vitals:   06/21/24 0924  TempSrc:   PainSc: 0-No pain                 VAN STAVEREN,Lurae Hornbrook

## 2024-06-21 NOTE — Transfer of Care (Signed)
 Immediate Anesthesia Transfer of Care Note  Patient: Amy Gilbert  Procedure(s) Performed: COLONOSCOPY POLYPECTOMY, INTESTINE  Patient Location: Endoscopy Unit  Anesthesia Type:General  Level of Consciousness: awake, alert , and oriented  Airway & Oxygen Therapy: Patient Spontanous Breathing  Post-op Assessment: Report given to RN and Post -op Vital signs reviewed and stable  Post vital signs: Reviewed and stable  Last Vitals:  Vitals Value Taken Time  BP 107/76 06/21/24 09:14  Temp    Pulse 88 06/21/24 09:14  Resp 20 06/21/24 09:14  SpO2 96 % 06/21/24 09:14    Last Pain:  Vitals:   06/21/24 0847  TempSrc: Tympanic         Complications: No notable events documented.

## 2024-06-21 NOTE — H&P (Signed)
 Rogelia Copping, MD [facility] [address] [place], [place] [address] [phone] Fax : [phone]  Primary Care Physician:  Valerio Melanie DASEN, NP Primary Gastroenterologist:  Dr. Copping  Pre-Procedure History & Physical: HPI:  Amy Gilbert is a 58 y.o. female is here for an colonoscopy.   Past Medical History:  Diagnosis Date   Anxiety    Depression    Hypothyroidism    Lumbago    Malaise and fatigue    Sciatica    Thyroid  disease     Past Surgical History:  Procedure Laterality Date   COLONOSCOPY WITH PROPOFOL  N/A 08/04/2018   Procedure: COLONOSCOPY WITH PROPOFOL ;  Surgeon: Janalyn Keene NOVAK, MD;  Location: [facility] ENDOSCOPY;  Service: Endoscopy;  Laterality: N/A;   DILATION AND CURETTAGE OF UTERUS     TUBAL LIGATION      Prior to Admission medications   Medication Sig Start Date End Date Taking? Authorizing Provider  levothyroxine  (SYNTHROID ) 75 MCG tablet Take 1 tablet (75 mcg total) by mouth daily. 05/27/24  Yes Cannady, Jolene T, NP  cyclobenzaprine  (FLEXERIL ) 10 MG tablet Take 1 tablet (10 mg total) by mouth 3 (three) times daily as needed for muscle spasms. 12/21/23   Cannady, Jolene T, NP    Allergies as of 05/17/2024 - Review Complete 05/17/2024  Allergen Reaction Noted   Rosuvastatin  Other (See Comments) 10/27/2023    Family History  Problem Relation Age of Onset   Cancer Mother        liver age 45's   Lung disease Mother    Cancer Father        non hodgkins lymphoma   Hypertension Father    Diabetes Maternal Grandmother    Cancer Maternal Grandfather        lung   Diabetes Paternal Grandmother    Diabetes Brother    Heart attack Brother    Breast cancer Neg Hx     Social History   Socioeconomic History   Marital status: Single    Spouse name: Not on file   Number of children: Not on file   Years of education: Not on file   Highest education level: Not on file  Occupational History   Not on file  Tobacco Use   Smoking  status: Never   Smokeless tobacco: Never  Vaping Use   Vaping status: Never Used  Substance and Sexual Activity   Alcohol use: Yes    Alcohol/week: 0.0 standard drinks of alcohol    Comment: rarely   Drug use: No   Sexual activity: Yes  Other Topics Concern   Not on file  Social History Narrative   Not on file   Social Drivers of Health   Financial Resource Strain: Low Risk  (05/27/2024)   Overall Financial Resource Strain (CARDIA)    Difficulty of Paying Living Expenses: Not hard at all  Food Insecurity: Not on file  Transportation Needs: Not on file  Physical Activity: Sufficiently Active (05/27/2024)   Exercise Vital Sign    Days of Exercise per Week: 3 days    Minutes of Exercise per Session: 80 min  Stress: No Stress Concern Present (05/27/2024)   [employer] of Occupational Health - Occupational Stress Questionnaire    Feeling of Stress: Only a little  Social Connections: Moderately Isolated (05/27/2024)   Social Connection and Isolation Panel    Frequency of Communication with Friends and Family: More than three times a week    Frequency of Social Gatherings with Friends  and Family: Once a week    Attends Religious Services: Never    Active Member of Clubs or Organizations: No    Attends Banker Meetings: Never    Marital Status: Married  Catering manager Violence: Not At Risk (05/27/2024)   Humiliation, Afraid, Rape, and Kick questionnaire    Fear of Current or Ex-Partner: No    Emotionally Abused: No    Physically Abused: No    Sexually Abused: No    Review of Systems: See HPI, otherwise negative ROS  Physical Exam: LMP 01/14/2024 Comment: tubal ligation 1990 General:   Alert,  pleasant and cooperative in NAD Head:  Normocephalic and atraumatic. Neck:  Supple; no masses or thyromegaly. Lungs:  Clear throughout to auscultation.    Heart:  Regular rate and rhythm. Abdomen:  Soft, nontender and nondistended. Normal bowel sounds, without  guarding, and without rebound.   Neurologic:  Alert and  oriented x4;  grossly normal neurologically.  Impression/Plan: Amy Gilbert is here for an colonoscopy to be performed for a history of adenomatous polyps on 2019  Risks, benefits, limitations, and alternatives regarding  colonoscopy have been reviewed with the patient.  Questions have been answered.  All parties agreeable.   Rogelia Copping, MD  06/21/2024, 8:46 AM

## 2024-06-21 NOTE — Anesthesia Preprocedure Evaluation (Signed)
 Anesthesia Evaluation  Patient identified by MRN, date of birth, ID band Patient awake    Reviewed: Allergy & Precautions, NPO status , Patient's Chart, lab work & pertinent test results  Airway Mallampati: II  TM Distance: >3 FB Neck ROM: full    Dental  (+) Teeth Intact   Pulmonary neg pulmonary ROS   Pulmonary exam normal breath sounds clear to auscultation       Cardiovascular Exercise Tolerance: Good negative cardio ROS Normal cardiovascular exam Rhythm:Regular Rate:Normal     Neuro/Psych   Anxiety     negative neurological ROS  negative psych ROS   GI/Hepatic negative GI ROS, Neg liver ROS,,,  Endo/Other  negative endocrine ROSHypothyroidism  Class 3 obesity  Renal/GU negative Renal ROS  negative genitourinary   Musculoskeletal negative musculoskeletal ROS (+)    Abdominal  (+) + obese  Peds negative pediatric ROS (+)  Hematology negative hematology ROS (+)   Anesthesia Other Findings Past Medical History: No date: Anxiety No date: Depression No date: Hypothyroidism No date: Lumbago No date: Malaise and fatigue No date: Sciatica No date: Thyroid  disease  Past Surgical History: 08/04/2018: COLONOSCOPY WITH PROPOFOL ; N/A     Comment:  Procedure: COLONOSCOPY WITH PROPOFOL ;  Surgeon:               Janalyn Keene NOVAK, MD;  Location: [facility] ENDOSCOPY;                Service: Endoscopy;  Laterality: N/A; No date: DILATION AND CURETTAGE OF UTERUS No date: TUBAL LIGATION     Reproductive/Obstetrics negative OB ROS                              Anesthesia Physical Anesthesia Plan  ASA: 2  Anesthesia Plan: General   Post-op Pain Management:    Induction: Intravenous  PONV Risk Score and Plan: Propofol  infusion and TIVA  Airway Management Planned: Natural Airway and Nasal Cannula  Additional Equipment:   Intra-op Plan:   Post-operative Plan:   Informed Consent: I  have reviewed the patients History and Physical, chart, labs and discussed the procedure including the risks, benefits and alternatives for the proposed anesthesia with the patient or authorized representative who has indicated his/her understanding and acceptance.     Dental Advisory Given  Plan Discussed with: CRNA  Anesthesia Plan Comments:          Anesthesia Quick Evaluation

## 2024-06-21 NOTE — Anesthesia Procedure Notes (Signed)
 Date/Time: 06/21/2024 8:53 AM  Performed by: Dominica Krabbe, CRNAPre-anesthesia Checklist: Patient identified, Emergency Drugs available, Suction available, Patient being monitored and Timeout performed Patient Re-evaluated:Patient Re-evaluated prior to induction Oxygen Delivery Method: Nasal cannula Preoxygenation: Pre-oxygenation with 100% oxygen Induction Type: IV induction

## 2024-06-21 NOTE — Op Note (Signed)
 [facility] Patient Name: Amy Gilbert Procedure Date: 06/21/2024 8:52 AM MRN: [id number] Account #: [id number] Date of Birth: Dec 24, 1965 Admit Type: Outpatient Age: 58 Room: [facility] ENDO ROOM 4 Gender: Female Note Status: Finalized Instrument Name: [id number] [id number] Procedure:             Colonoscopy Indications:           High risk colon cancer surveillance: Personal history                         of colonic polyps Providers:             Rogelia Copping MD, MD Referring MD:          Melanie DASEN. Cannady (Referring MD) Medicines:             Propofol  per Anesthesia Complications:         No immediate complications. Procedure:             Pre-Anesthesia Assessment:                        - Prior to the procedure, a History and Physical was                         performed, and patient medications and allergies were                         reviewed. The patient's tolerance of previous                         anesthesia was also reviewed. The risks and benefits                         of the procedure and the sedation options and risks                         were discussed with the patient. All questions were                         answered, and informed consent was obtained. Prior                         Anticoagulants: The patient has taken no anticoagulant                         or antiplatelet agents. ASA Grade Assessment: II - A                         patient with mild systemic disease. After reviewing                         the risks and benefits, the patient was deemed in                         satisfactory condition to undergo the procedure.                        After obtaining informed consent, the colonoscope was  passed under direct vision. Throughout the procedure,                         the patient's blood pressure, pulse, and oxygen                         saturations were monitored continuously. The                          Colonoscope was introduced through the anus and                         advanced to the the cecum, identified by appendiceal                         orifice and ileocecal valve. The colonoscopy was                         performed without difficulty. The patient tolerated                         the procedure well. The quality of the bowel                         preparation was excellent. Findings:      The perianal and digital rectal examinations were normal.      A 2 mm polyp was found in the cecum. The polyp was sessile. The polyp       was removed with a cold biopsy forceps. Resection and retrieval were       complete.      A 2 mm polyp was found in the transverse colon. The polyp was sessile.       The polyp was removed with a cold biopsy forceps. Resection and       retrieval were complete.      Non-bleeding internal hemorrhoids were found during retroflexion. The       hemorrhoids were Grade I (internal hemorrhoids that do not prolapse). Impression:            - One 2 mm polyp in the cecum, removed with a cold                         biopsy forceps. Resected and retrieved.                        - One 2 mm polyp in the transverse colon, removed with                         a cold biopsy forceps. Resected and retrieved.                        - Non-bleeding internal hemorrhoids. Recommendation:        - Discharge patient to home.                        - Resume previous diet.                        - Continue present medications.                        -  Await pathology results.                        - Repeat colonoscopy in 7 years for surveillance. Procedure Code(s):     --- Professional ---                        [phone], Colonoscopy, flexible; with biopsy, single or                         multiple Diagnosis Code(s):     --- Professional ---                        Z86.010, Personal history of colonic polyps                        D12.0, Benign neoplasm of  cecum CPT copyright 2022 [employer]. All rights reserved. The codes documented in this report are preliminary and upon coder review may  be revised to meet current compliance requirements. Rogelia Copping MD, MD 06/21/2024 9:11:37 AM This report has been signed electronically. Number of Addenda: 0 Note Initiated On: 06/21/2024 8:52 AM Scope Withdrawal Time: 0 hours 6 minutes 35 seconds  Total Procedure Duration: 0 hours 9 minutes 44 seconds  Estimated Blood Loss:  Estimated blood loss: none.      [facility]

## 2024-06-22 ENCOUNTER — Encounter: Payer: Self-pay | Admitting: Gastroenterology

## 2024-06-22 ENCOUNTER — Ambulatory Visit: Payer: Self-pay | Admitting: Gastroenterology

## 2024-06-22 LAB — SURGICAL PATHOLOGY

## 2025-05-30 ENCOUNTER — Encounter: Admitting: Nurse Practitioner
